# Patient Record
Sex: Female | Born: 1980 | Race: White | Hispanic: Yes | Marital: Single | Smoking: Never smoker
Health system: Southern US, Community
[De-identification: ages and names within clinical notes are randomized; demographics above are authoritative.]

## PROBLEM LIST (undated history)

## (undated) ENCOUNTER — Inpatient Hospital Stay (HOSPITAL_COMMUNITY): Payer: Self-pay

## (undated) DIAGNOSIS — K8020 Calculus of gallbladder without cholecystitis without obstruction: Secondary | ICD-10-CM

## (undated) DIAGNOSIS — K2970 Gastritis, unspecified, without bleeding: Secondary | ICD-10-CM

## (undated) HISTORY — PX: NO PAST SURGERIES: SHX2092

## (undated) SURGERY — LAPAROSCOPIC CHOLECYSTECTOMY
Anesthesia: Choice

---

## 2005-08-14 ENCOUNTER — Ambulatory Visit: Payer: Self-pay | Admitting: Family Medicine

## 2005-08-14 DIAGNOSIS — N940 Mittelschmerz: Secondary | ICD-10-CM

## 2005-08-15 ENCOUNTER — Ambulatory Visit: Payer: Self-pay | Admitting: *Deleted

## 2005-09-17 ENCOUNTER — Encounter (INDEPENDENT_AMBULATORY_CARE_PROVIDER_SITE_OTHER): Payer: Self-pay | Admitting: Family Medicine

## 2005-09-17 ENCOUNTER — Ambulatory Visit: Payer: Self-pay | Admitting: Family Medicine

## 2005-09-17 LAB — CONVERTED CEMR LAB: Pap Smear: NORMAL

## 2006-12-05 ENCOUNTER — Ambulatory Visit: Payer: Self-pay | Admitting: Internal Medicine

## 2006-12-05 ENCOUNTER — Telehealth (INDEPENDENT_AMBULATORY_CARE_PROVIDER_SITE_OTHER): Payer: Self-pay | Admitting: *Deleted

## 2006-12-05 ENCOUNTER — Encounter (INDEPENDENT_AMBULATORY_CARE_PROVIDER_SITE_OTHER): Payer: Self-pay | Admitting: Family Medicine

## 2006-12-05 LAB — CONVERTED CEMR LAB
Bilirubin Urine: NEGATIVE
Ketones, urine, test strip: NEGATIVE
Nitrite: NEGATIVE
Specific Gravity, Urine: <1.005
Urobilinogen, UA: NEGATIVE

## 2006-12-06 ENCOUNTER — Encounter (INDEPENDENT_AMBULATORY_CARE_PROVIDER_SITE_OTHER): Payer: Self-pay | Admitting: Internal Medicine

## 2006-12-09 ENCOUNTER — Telehealth (INDEPENDENT_AMBULATORY_CARE_PROVIDER_SITE_OTHER): Payer: Self-pay | Admitting: *Deleted

## 2006-12-09 ENCOUNTER — Encounter (INDEPENDENT_AMBULATORY_CARE_PROVIDER_SITE_OTHER): Payer: Self-pay | Admitting: Nurse Practitioner

## 2007-07-21 ENCOUNTER — Encounter (INDEPENDENT_AMBULATORY_CARE_PROVIDER_SITE_OTHER): Payer: Self-pay | Admitting: Family Medicine

## 2007-07-21 ENCOUNTER — Ambulatory Visit: Payer: Self-pay | Admitting: Family Medicine

## 2007-07-21 LAB — CONVERTED CEMR LAB
Bilirubin Urine: NEGATIVE
Preg, Serum: NEGATIVE
Protein, U semiquant: NEGATIVE
Specific Gravity, Urine: <1.005
Urobilinogen, UA: NEGATIVE
WBC Urine, dipstick: NEGATIVE
pH: 6

## 2007-07-22 ENCOUNTER — Ambulatory Visit: Payer: Self-pay | Admitting: Family Medicine

## 2007-09-16 ENCOUNTER — Ambulatory Visit: Payer: Self-pay | Admitting: Family Medicine

## 2008-11-18 ENCOUNTER — Emergency Department (HOSPITAL_COMMUNITY): Admission: EM | Admit: 2008-11-18 | Discharge: 2008-11-19 | Payer: Self-pay | Admitting: Emergency Medicine

## 2011-03-27 NOTE — L&D Delivery Note (Addendum)
Delivery Note At 4:02 AM a viable and healthy female was delivered via Vaginal, Spontaneous Delivery (Presentation: Left Occiput Anterior).  APGAR: 8, 9; weight pending .  Infant placed in mother's arms immediately following delivery.  Placenta status: spontaneous, intact.  Cord: 3-vessel with the following complications: None.    Anesthesia: Epidural  Episiotomy: none  Lacerations: none Suture Repair: N/A Est. Blood Loss (mL): 300   Mom to postpartum.  Baby to nursery-stable.  LEFTWICH-KIRBY, LISA 09/22/2011, 4:21 AM   I agree with above note and its content

## 2011-04-04 ENCOUNTER — Other Ambulatory Visit (HOSPITAL_COMMUNITY): Payer: Self-pay | Admitting: Physician Assistant

## 2011-04-04 DIAGNOSIS — R1021 Pelvic and perineal pain right side: Secondary | ICD-10-CM

## 2011-04-04 DIAGNOSIS — R102 Pelvic and perineal pain: Secondary | ICD-10-CM

## 2011-04-04 DIAGNOSIS — Z3689 Encounter for other specified antenatal screening: Secondary | ICD-10-CM

## 2011-04-06 ENCOUNTER — Ambulatory Visit (HOSPITAL_COMMUNITY)
Admission: RE | Admit: 2011-04-06 | Discharge: 2011-04-06 | Disposition: A | Discharge status: Home / Self Care | Payer: Self-pay | Source: Ambulatory Visit | Attending: Physician Assistant | Admitting: Physician Assistant

## 2011-04-06 DIAGNOSIS — R102 Pelvic and perineal pain: Secondary | ICD-10-CM

## 2011-04-06 DIAGNOSIS — Z3689 Encounter for other specified antenatal screening: Secondary | ICD-10-CM | POA: Insufficient documentation

## 2011-04-06 DIAGNOSIS — R1031 Right lower quadrant pain: Secondary | ICD-10-CM | POA: Insufficient documentation

## 2011-04-06 DIAGNOSIS — O99891 Other specified diseases and conditions complicating pregnancy: Secondary | ICD-10-CM | POA: Insufficient documentation

## 2011-04-10 ENCOUNTER — Other Ambulatory Visit (HOSPITAL_COMMUNITY): Payer: Self-pay | Admitting: Physician Assistant

## 2011-04-10 DIAGNOSIS — K8020 Calculus of gallbladder without cholecystitis without obstruction: Secondary | ICD-10-CM

## 2011-04-13 ENCOUNTER — Ambulatory Visit (HOSPITAL_COMMUNITY)
Admission: RE | Admit: 2011-04-13 | Discharge: 2011-04-13 | Disposition: A | Discharge status: Home / Self Care | Payer: Self-pay | Source: Ambulatory Visit | Attending: Physician Assistant | Admitting: Physician Assistant

## 2011-04-13 DIAGNOSIS — O9989 Other specified diseases and conditions complicating pregnancy, childbirth and the puerperium: Secondary | ICD-10-CM | POA: Insufficient documentation

## 2011-04-13 DIAGNOSIS — K8020 Calculus of gallbladder without cholecystitis without obstruction: Secondary | ICD-10-CM | POA: Insufficient documentation

## 2011-04-13 DIAGNOSIS — R1011 Right upper quadrant pain: Secondary | ICD-10-CM | POA: Insufficient documentation

## 2011-04-24 ENCOUNTER — Encounter (INDEPENDENT_AMBULATORY_CARE_PROVIDER_SITE_OTHER): Payer: Self-pay | Admitting: General Surgery

## 2011-04-24 ENCOUNTER — Ambulatory Visit (INDEPENDENT_AMBULATORY_CARE_PROVIDER_SITE_OTHER): Payer: Self-pay | Admitting: General Surgery

## 2011-04-24 DIAGNOSIS — K8020 Calculus of gallbladder without cholecystitis without obstruction: Secondary | ICD-10-CM | POA: Insufficient documentation

## 2011-04-24 NOTE — Progress Notes (Signed)
Subjective:     Patient ID: Katrina Carter, female   DOB: 23-Sep-1980, 31 y.o.   MRN: [id number]  HPI We're asked to see the patient in consultation by Dr. Wyvonnia Dusky to evaluate her for gallstones. The patient is a 31 year old Hispanic female who is about [redacted] weeks pregnant who has been experiencing some right flank pain. The pain has not been associated with nausea and vomiting. She has been able to eat and gain weight. She does also no problems with constipation. This has been going on since she found out she was pregnant.  Review of Systems  Constitutional: Negative.   HENT: Negative.   Eyes: Negative.   Respiratory: Negative.   Cardiovascular: Negative.   Gastrointestinal: Positive for constipation and abdominal distention.  Genitourinary: Negative.   Musculoskeletal: Negative.   Skin: Negative.   Neurological: Negative.   Hematological: Negative.   Psychiatric/Behavioral: Negative.        Objective:   Physical Exam  Constitutional: She is oriented to person, place, and time. She appears well-developed and well-nourished.  HENT:  Head: Normocephalic and atraumatic.  Eyes: Conjunctivae and EOM are normal. Pupils are equal, round, and reactive to light.  Neck: Normal range of motion. Neck supple.  Cardiovascular: Normal rate, regular rhythm and normal heart sounds.   Pulmonary/Chest: Effort normal and breath sounds normal.  Abdominal: Soft. Bowel sounds are normal.       Mild tenderness in the right upper quadrant. No guarding or peritonitis. No palpable upper abdominal mass.  Musculoskeletal: Normal range of motion.  Neurological: She is alert and oriented to person, place, and time.  Skin: Skin is warm and dry.  Psychiatric: She has a normal mood and affect. Her behavior is normal.       Assessment:     The patient appears to have gallstones on a recent abdominal ultrasound. The ultrasound also suggested that she may have common duct stones. Fortunately her liver  functions are normal and she seems to be tolerating this well. She has some mild right upper quadrant pain but she is able to eat and gain weight appropriately.    Plan:     At this point since she can eat and gain weight I think we should treat her pain symptomatically and try to get her through the pregnancy. After she delivers then we can plan for laparoscopic cholecystectomy. This will minimize any risks to the baby. She is in favor of this plan. We will plan to see her back after she delivers unless things change in the meantime.

## 2011-04-26 ENCOUNTER — Encounter (INDEPENDENT_AMBULATORY_CARE_PROVIDER_SITE_OTHER): Payer: Self-pay

## 2011-06-29 ENCOUNTER — Encounter (HOSPITAL_COMMUNITY): Payer: Self-pay

## 2011-06-29 ENCOUNTER — Inpatient Hospital Stay (HOSPITAL_COMMUNITY)
Admission: AD | Admit: 2011-06-29 | Discharge: 2011-06-30 | Disposition: A | Discharge status: Home / Self Care | Payer: Self-pay | Source: Ambulatory Visit | Attending: Obstetrics and Gynecology | Admitting: Obstetrics and Gynecology

## 2011-06-29 ENCOUNTER — Inpatient Hospital Stay (HOSPITAL_COMMUNITY): Payer: Self-pay

## 2011-06-29 DIAGNOSIS — O99891 Other specified diseases and conditions complicating pregnancy: Secondary | ICD-10-CM | POA: Insufficient documentation

## 2011-06-29 DIAGNOSIS — F329 Major depressive disorder, single episode, unspecified: Secondary | ICD-10-CM | POA: Clinically undetermined

## 2011-06-29 DIAGNOSIS — O479 False labor, unspecified: Secondary | ICD-10-CM

## 2011-06-29 DIAGNOSIS — F32A Depression, unspecified: Secondary | ICD-10-CM | POA: Clinically undetermined

## 2011-06-29 DIAGNOSIS — R1031 Right lower quadrant pain: Secondary | ICD-10-CM | POA: Insufficient documentation

## 2011-06-29 LAB — URINALYSIS, ROUTINE W REFLEX MICROSCOPIC
Bilirubin Urine: NEGATIVE
Glucose, UA: NEGATIVE mg/dL
Hgb urine dipstick: NEGATIVE
Specific Gravity, Urine: <1.005 — ABNORMAL LOW (ref 1.005–1.030)
pH: 6.5 (ref 5.0–8.0)

## 2011-06-29 LAB — CBC
HCT: 36.8 % (ref 36.0–46.0)
Hemoglobin: 12.4 g/dL (ref 12.0–15.0)
MCH: 33.4 pg (ref 26.0–34.0)
MCHC: 33.7 g/dL (ref 30.0–36.0)
MCV: 99.2 fL (ref 78.0–100.0)
RBC: 3.71 MIL/uL — ABNORMAL LOW (ref 3.87–5.11)

## 2011-06-29 LAB — WET PREP, GENITAL
Clue Cells Wet Prep HPF POC: NONE SEEN
Trich, Wet Prep: NONE SEEN
Yeast Wet Prep HPF POC: NONE SEEN

## 2011-06-29 LAB — URINE MICROSCOPIC-ADD ON

## 2011-06-29 MED ORDER — TERBUTALINE SULFATE 1 MG/ML IJ SOLN
INTRAMUSCULAR | Status: AC
  Filled 2011-06-29: qty 1

## 2011-06-29 MED ORDER — NIFEDIPINE 10 MG PO CAPS
10.0000 mg | ORAL_CAPSULE | Freq: Three times a day (TID) | ORAL | Status: DC
  Administered 2011-06-29: 10 mg via ORAL
  Filled 2011-06-29 (×2): qty 1

## 2011-06-29 MED ORDER — NIFEDIPINE 10 MG PO CAPS
10.0000 mg | ORAL_CAPSULE | Freq: Three times a day (TID) | ORAL | Status: DC
  Administered 2011-06-29: 10 mg via ORAL

## 2011-06-29 MED ORDER — TERBUTALINE SULFATE 1 MG/ML IJ SOLN
0.2500 mg | Freq: Once | INTRAMUSCULAR | Status: AC
  Administered 2011-06-29: 0.25 mg via SUBCUTANEOUS

## 2011-06-29 NOTE — MAU Note (Signed)
[employer] translator [id number]. Patient states she was seen in the office today and was having contractions and was sent to MAU. Patient has a placenta previa. Reports no bleeding but does have some contractions.

## 2011-06-29 NOTE — MAU Provider Note (Signed)
Chief Complaint:  Contractions   First Provider Initiated Contact with Patient 06/29/11 1901      HPI  Adline Kirshenbaum is  31 y.o. G2P1001 at [redacted]w[redacted]d presents with suprapubic pain and RLQ pain x2 days worsening today.  Denies contractions, leakage of fluid or vaginal bleeding. Good fetal movement.  She is seeing [facility] for prenatal care but had a recent U/S at Dr Elsie Stain office for reduced costs.  Her U/S at Dr Elsie Stain office indicated a placenta previa and she was given precautions and told another U/S would be performed later in pregnancy to see if previa resolved.   Pregnancy Course: uncomplicated  Past Medical History: Past Medical History  Diagnosis Date  . Abdominal pain   . Placenta previa antepartum     Past Surgical History: Past Surgical History  Procedure Date  . No past surgeries     Family History: Family History  Problem Relation Age of Onset  . Anesthesia problems Neg Hx   . Hypotension Neg Hx   . Malignant hyperthermia Neg Hx   . Pseudochol deficiency Neg Hx     Social History: History  Substance Use Topics  . Smoking status: Never Smoker   . Smokeless tobacco: Never Used  . Alcohol Use: No    Allergies: No Known Allergies  Meds:  Prescriptions prior to admission  Medication Sig Dispense Refill  . Prenatal Vit-Fe Fumarate-FA (PRENATAL MULTIVITAMIN) TABS Take 1 tablet by mouth daily.          Physical Exam  Blood pressure 114/70, pulse 87, temperature 98.7 F (37.1 C), temperature source Oral, resp. rate 16, height 4' 10.5" (1.486 m), weight 72.303 kg (159 lb 6.4 oz), SpO2 100.00%. GENERAL: Well-developed, well-nourished female in no acute distress.  ABDOMEN: Soft, nontender, nondistended, gravid.  EXTREMITIES: Nontender, no edema, 2+ distal pulses. Pelvic exam deferred until U/S confirmation of previa  FHT:  Baseline 135 , moderate variability, accelerations present, no decelerations Contractions: uterine irritability   Labs: CBC and  ABO/Rh ordered Results for orders placed during the hospital encounter of 06/29/11 (from the past 24 hour(s))  URINALYSIS, ROUTINE W REFLEX MICROSCOPIC     Status: Abnormal   Collection Time   06/29/11  6:00 PM      Component Value Range   Color, Urine STRAW (*) YELLOW    APPearance HAZY (*) CLEAR    Specific Gravity, Urine <1.005 (*) 1.005 - 1.030    pH 6.5  5.0 - 8.0    Glucose, UA NEGATIVE  NEGATIVE (mg/dL)   Hgb urine dipstick NEGATIVE  NEGATIVE    Bilirubin Urine NEGATIVE  NEGATIVE    Ketones, ur NEGATIVE  NEGATIVE (mg/dL)   Protein, ur NEGATIVE  NEGATIVE (mg/dL)   Urobilinogen, UA 0.2  0.0 - 1.0 (mg/dL)   Nitrite NEGATIVE  NEGATIVE    Leukocytes, UA SMALL (*) NEGATIVE   URINE MICROSCOPIC-ADD ON     Status: Abnormal   Collection Time   06/29/11  6:00 PM      Component Value Range   Squamous Epithelial / LPF MANY (*) RARE    WBC, UA 3-6  <3 (WBC/hpf)   Bacteria, UA RARE  RARE    Imaging:  Limited U/S to evaluate placenta ordered  Assessment/Plan: Pt needs U/S to evaluate previa per Dr Marice Potter prior to pt arrival Pt history and symptoms discussed with Dr Emelda Fear.  Plan to send pt for limited U/S to evaluate previa prior to pelvic exam.  Care assumed by Wynelle Bourgeois, CNM at  2020 LEFTWICH-KIRBY, LISA 4/5/20137:54 PM  Speculum exam done Cervix is long and closed SP area is tender to touch, but sonographer did not note tenderness with that exam GC/Chlam and wet prep collected  Will give one dose of Procardia. >> UCs continued, gave second dose, they continued. >> gave one  Dose of Terb with resolution of UCs. Will d/c home with F/U in HD clinic  Discussed with Dr Emelda Fear who recommends d/c home.

## 2011-06-29 NOTE — MAU Note (Signed)
Per Maday, Spanish interpreter, pt states suprapubic pain since Wednesday, was seen at Dr. Elsie Stain office for routine appt. Noted pt having ctx's. Was sent to MAU for further eval. Pt notes burning with voiding. Denies bleeding today, noted yellow thick non odorous vaginal d/c

## 2011-06-30 LAB — GC/CHLAMYDIA PROBE AMP, GENITAL
Chlamydia, DNA Probe: NEGATIVE
GC Probe Amp, Genital: NEGATIVE

## 2011-06-30 NOTE — Discharge Instructions (Signed)
Prevencin de Sport and exercise psychologist (Preventing Preterm Labor) Un parto prematuro ocurre cuando la mujer embarazada tiene contracciones uterinas que causan la apertura, el acortamiento y el afinamiento del cuello del tero, antes de las 37 semanas de [place]. Tendr contracciones regulares cada 2 a 3 minutos. Esto generalmente causa molestias o dolor. CUIDADOS EN EL HOGAR  Consuma una dieta saludable.   [employer] las vitaminas segn le haya indicado el mdico.   Beba una cantidad de lquido suficiente como para Pharmacologist la orina de tono claro o color amarillo plido todos [place].   Descanse y duerma.   No tenga relaciones sexuales si tiene un parto prematuro o alto riesgo de tenerlo.   Siga las instrucciones del mdico acerca de su [place], los medicamentos y los exmenes.   Evite el estrs.   Evite los esfuerzos extenuantes o la actividad fsica extensa.   No fume.  SOLICITE AYUDA DE INMEDIATO SI:   Tiene contracciones.   Siente dolor abdominal.   Tiene sangrado que proviene de la vagina.   Siente dolor al [employer].   Observa una secrecin anormal que proviene de la vagina.   Tiene una temperatura oral de ms de 102 F (38.9 C).  ASEGRESE DE QUE:  Comprende estas instrucciones.   Controlar su enfermedad.   Solicitar ayuda si no mejora o si empeora.  Document Released: 04/14/2010 Document Revised: 03/01/2011 [facility] Patient Information 2012 [place], [place].

## 2011-07-01 NOTE — MAU Provider Note (Signed)
Attestation of Attending Supervision of Advanced Practitioner: Evaluation and management procedures were performed by the PA/NP/CNM/OB Fellow under my supervision/collaboration. Chart reviewed and agree with management and plan. I personally observed the ultrasound performance, performed abdominal exam, and the cervix was nontender during ultrasound assessment, and the placenta showed no suggestion of abruption or bleeding. Will follow up with primary care site. Nikolina Simerson V 07/01/2011 12:02 PM

## 2011-07-31 ENCOUNTER — Encounter (INDEPENDENT_AMBULATORY_CARE_PROVIDER_SITE_OTHER): Payer: Self-pay | Admitting: General Surgery

## 2011-08-31 LAB — OB RESULTS CONSOLE GBS: GBS: NEGATIVE

## 2011-09-21 ENCOUNTER — Inpatient Hospital Stay (HOSPITAL_COMMUNITY): Payer: Medicaid Other | Admitting: Anesthesiology

## 2011-09-21 ENCOUNTER — Encounter (HOSPITAL_COMMUNITY): Payer: Self-pay | Admitting: *Deleted

## 2011-09-21 ENCOUNTER — Encounter (HOSPITAL_COMMUNITY): Payer: Self-pay | Admitting: Anesthesiology

## 2011-09-21 ENCOUNTER — Inpatient Hospital Stay (HOSPITAL_COMMUNITY): Payer: Medicaid Other

## 2011-09-21 ENCOUNTER — Inpatient Hospital Stay (HOSPITAL_COMMUNITY)
Admission: AD | Admit: 2011-09-21 | Discharge: 2011-09-23 | DRG: 774 | Disposition: A | Discharge status: Home / Self Care | Payer: Medicaid Other | Source: Ambulatory Visit | Attending: Obstetrics & Gynecology | Admitting: Obstetrics & Gynecology

## 2011-09-21 DIAGNOSIS — O4690 Antepartum hemorrhage, unspecified, unspecified trimester: Secondary | ICD-10-CM | POA: Diagnosis present

## 2011-09-21 DIAGNOSIS — O4100X0 Oligohydramnios, unspecified trimester, not applicable or unspecified: Principal | ICD-10-CM | POA: Diagnosis present

## 2011-09-21 HISTORY — DX: Calculus of gallbladder without cholecystitis without obstruction: K80.20

## 2011-09-21 LAB — CBC
HCT: 37.4 % (ref 36.0–46.0)
Hemoglobin: 12.8 g/dL (ref 12.0–15.0)
MCH: 33.5 pg (ref 26.0–34.0)
MCHC: 34.2 g/dL (ref 30.0–36.0)
MCV: 97.9 fL (ref 78.0–100.0)
Platelets: 225 K/uL (ref 150–400)
RBC: 3.82 MIL/uL — ABNORMAL LOW (ref 3.87–5.11)
RDW: 13.3 % (ref 11.5–15.5)
WBC: 10.2 K/uL (ref 4.0–10.5)

## 2011-09-21 MED ORDER — EPHEDRINE 5 MG/ML INJ
10.0000 mg | INTRAVENOUS | Status: DC | PRN
  Administered 2011-09-21: 10 mg via INTRAVENOUS
  Filled 2011-09-21: qty 4

## 2011-09-21 MED ORDER — FENTANYL 2.5 MCG/ML BUPIVACAINE 1/10 % EPIDURAL INFUSION (WH - ANES)
14.0000 mL/h | INTRAMUSCULAR | Status: DC
  Administered 2011-09-22: 14 mL/h via EPIDURAL
  Filled 2011-09-21 (×2): qty 60

## 2011-09-21 MED ORDER — IBUPROFEN 600 MG PO TABS
600.0000 mg | ORAL_TABLET | Freq: Four times a day (QID) | ORAL | Status: DC | PRN
  Administered 2011-09-22: 600 mg via ORAL
  Filled 2011-09-21: qty 1

## 2011-09-21 MED ORDER — ONDANSETRON HCL 4 MG/2ML IJ SOLN: 4.0000 mg | Freq: Four times a day (QID) | INTRAMUSCULAR | Status: DC | PRN

## 2011-09-21 MED ORDER — SODIUM BICARBONATE 8.4 % IV SOLN
INTRAVENOUS | Status: DC | PRN
  Administered 2011-09-21: 4 mL via EPIDURAL

## 2011-09-21 MED ORDER — LIDOCAINE HCL (PF) 1 % IJ SOLN
30.0000 mL | INTRAMUSCULAR | Status: DC | PRN
  Filled 2011-09-21: qty 30

## 2011-09-21 MED ORDER — PHENYLEPHRINE 40 MCG/ML (10ML) SYRINGE FOR IV PUSH (FOR BLOOD PRESSURE SUPPORT): 80.0000 ug | PREFILLED_SYRINGE | INTRAVENOUS | Status: DC | PRN

## 2011-09-21 MED ORDER — FLEET ENEMA 7-19 GM/118ML RE ENEM: 1.0000 | ENEMA | RECTAL | Status: DC | PRN

## 2011-09-21 MED ORDER — PHENYLEPHRINE 40 MCG/ML (10ML) SYRINGE FOR IV PUSH (FOR BLOOD PRESSURE SUPPORT)
80.0000 ug | PREFILLED_SYRINGE | INTRAVENOUS | Status: DC | PRN
  Administered 2011-09-21: 80 ug via INTRAVENOUS
  Filled 2011-09-21: qty 5

## 2011-09-21 MED ORDER — EPHEDRINE 5 MG/ML INJ: 10.0000 mg | INTRAVENOUS | Status: DC | PRN

## 2011-09-21 MED ORDER — DIPHENHYDRAMINE HCL 50 MG/ML IJ SOLN: 12.5000 mg | INTRAMUSCULAR | Status: DC | PRN

## 2011-09-21 MED ORDER — LACTATED RINGERS IV SOLN: 500.0000 mL | INTRAVENOUS | Status: DC | PRN

## 2011-09-21 MED ORDER — NALBUPHINE SYRINGE 5 MG/0.5 ML
5.0000 mg | INJECTION | INTRAMUSCULAR | Status: DC | PRN
  Administered 2011-09-21 (×2): 10 mg via INTRAVENOUS
  Filled 2011-09-21 (×3): qty 1

## 2011-09-21 MED ORDER — ACETAMINOPHEN 325 MG PO TABS: 650.0000 mg | ORAL_TABLET | ORAL | Status: DC | PRN

## 2011-09-21 MED ORDER — OXYCODONE-ACETAMINOPHEN 5-325 MG PO TABS: 1.0000 | ORAL_TABLET | ORAL | Status: DC | PRN

## 2011-09-21 MED ORDER — OXYTOCIN 40 UNITS IN LACTATED RINGERS INFUSION - SIMPLE MED
62.5000 mL/h | Freq: Once | INTRAVENOUS | Status: AC
  Administered 2011-09-22: 62.5 mL/h via INTRAVENOUS
  Filled 2011-09-21: qty 1000

## 2011-09-21 MED ORDER — CITRIC ACID-SODIUM CITRATE 334-500 MG/5ML PO SOLN: 30.0000 mL | ORAL | Status: DC | PRN

## 2011-09-21 MED ORDER — FENTANYL 2.5 MCG/ML BUPIVACAINE 1/10 % EPIDURAL INFUSION (WH - ANES)
INTRAMUSCULAR | Status: DC | PRN
  Administered 2011-09-21: 13 mL/h via EPIDURAL

## 2011-09-21 MED ORDER — OXYTOCIN BOLUS FROM INFUSION
250.0000 mL | Freq: Once | INTRAVENOUS | Status: DC
  Filled 2011-09-21: qty 500

## 2011-09-21 MED ORDER — LACTATED RINGERS IV SOLN
INTRAVENOUS | Status: DC
  Administered 2011-09-21: 17:00:00 via INTRAVENOUS

## 2011-09-21 MED ORDER — LACTATED RINGERS IV SOLN
500.0000 mL | Freq: Once | INTRAVENOUS | Status: AC
  Administered 2011-09-21: 500 mL via INTRAVENOUS

## 2011-09-21 NOTE — Anesthesia Procedure Notes (Signed)
Epidural Patient location during procedure: OB  Preanesthetic Checklist Completed: patient identified, site marked, surgical consent, pre-op evaluation, timeout performed, IV checked, risks and benefits discussed and monitors and equipment checked  Epidural Patient position: sitting Prep: site prepped and draped and DuraPrep Patient monitoring: continuous pulse ox and blood pressure Approach: midline Injection technique: LOR air  Needle:  Needle type: Tuohy  Needle gauge: 17 G Needle length: 9 cm Needle insertion depth: 5 cm Catheter type: closed end flexible Catheter size: 19 Gauge Catheter at skin depth: 12 cm Test dose: negative  Assessment Events: blood not aspirated, injection not painful, no injection resistance, negative IV test and no paresthesia  Additional Notes Dosing of Epidural:  1st dose, through needle ............................................Marland Kitchen epi 1:200K + Xylocaine 40 mg  2nd dose, through catheter, after waiting 3 minutes...Marland KitchenMarland Kitchenepi 1:200K + Xylocaine 40 mg  3rd dose, through catheter after waiting 3 minutes .............................Marcaine   4mg    ( mg Marcaine are expressed as equivilent  cc's medication removed from the 0.1%Bupiv / fentanyl syringe from L&D pump)  ( 2% Xylo charted as a single dose in Epic Meds for ease of charting; actual dosing was fractionated as above, for saftey's sake)  As each dose occurred, patient was free of IV sx; and patient exhibited no evidence of SA injection.  Patient is more comfortable after epidural dosed. Please see RN's note for documentation of vital signs,and FHR which are stable.  Patient reminded not to try to ambulate with numb legs, and that an RN must be present the 1st time she attempts to get up.

## 2011-09-21 NOTE — Anesthesia Preprocedure Evaluation (Addendum)
Anesthesia Evaluation  Patient identified by MRN, date of birth, ID band Patient awake    Reviewed: Allergy & Precautions, H&P , Patient's Chart, lab work & pertinent test results  Airway Mallampati: II  TM Distance: >3 FB Neck ROM: full    Dental  (+) Teeth Intact   Pulmonary    breath sounds clear to auscultation       Cardiovascular  Rhythm:regular Rate:Normal     Neuro/Psych    GI/Hepatic   Endo/Other  Morbid obesity  Renal/GU      Musculoskeletal   Abdominal   Peds  Hematology   Anesthesia Other Findings       Reproductive/Obstetrics (+) Pregnancy                            Anesthesia Physical Anesthesia Plan  ASA: II  Anesthesia Plan: Epidural   Post-op Pain Management:    Induction:   Airway Management Planned:   Additional Equipment:   Intra-op Plan:   Post-operative Plan:   Informed Consent: I have reviewed the patients History and Physical, chart, labs and discussed the procedure including the risks, benefits and alternatives for the proposed anesthesia with the patient or authorized representative who has indicated his/her understanding and acceptance.   Dental Advisory Given  Plan Discussed with:   Anesthesia Plan Comments: (Labs checked- platelets confirmed with RN in room. Fetal heart tracing, per RN, reported to be stable enough for sitting procedure. Discussed epidural, and patient consents to the procedure:  included risk of possible headache,backache, failed block, allergic reaction, and nerve injury. This patient was asked if she had any questions or concerns before the procedure started.)        Anesthesia Quick Evaluation  

## 2011-09-21 NOTE — Progress Notes (Signed)
Report given to Urban Gibson by Ozella Almond RN

## 2011-09-21 NOTE — MAU Provider Note (Signed)
History    CSN: [id number] Arrival date and time: 09/21/11 1112  None   Chief Complaint  Patient presents with  . Labor Eval   HPI Comments: Pt presented for evaluation following onset of bright red bleed this AM.  She reports some pain associated with bleeding.  Has changed 3 pads in 2 hours that were saturated.  No loss of fluids.  Pain and bleeding have resolved at this time but now having intermittent contractions.  Feeling baby move normally.   OB History    Grav Para Term Preterm Abortions TAB SAB Ect Mult Living   2 1 1  0 0 0 0 0 0 1      Past Medical History  Diagnosis Date  . Abdominal pain     Past Surgical History  Procedure Date  . No past surgeries     Family History  Problem Relation Age of Onset  . Anesthesia problems Neg Hx   . Hypotension Neg Hx   . Malignant hyperthermia Neg Hx   . Pseudochol deficiency Neg Hx     History  Substance Use Topics  . Smoking status: Never Smoker   . Smokeless tobacco: Never Used  . Alcohol Use: No    Allergies: No Known Allergies  Prescriptions prior to admission  Medication Sig Dispense Refill  . Prenatal Vit-Fe Fumarate-FA (PRENATAL MULTIVITAMIN) TABS Take 1 tablet by mouth daily.        Review of Systems  Constitutional: Negative for fever and chills.  HENT: Negative for congestion.   Eyes: Negative for blurred vision and double vision.  Respiratory: Negative for cough, shortness of breath and wheezing.   Cardiovascular: Positive for leg swelling (mild throughout pregnancy; unchanged).  Gastrointestinal: Positive for abdominal pain. Negative for nausea and vomiting.  Genitourinary: Negative for dysuria, urgency and frequency.  Musculoskeletal: Negative for myalgias and joint pain.  Neurological: Negative.  Negative for headaches.  Endo/Heme/Allergies: Negative.   Psychiatric/Behavioral: Negative.    Physical Exam   Blood pressure 113/73, pulse 93, temperature 98.4 F (36.9 C), temperature source  Oral, resp. rate 16, height 4' 10.5" (1.486 m), weight 79.379 kg (175 lb), SpO2 100.00%.  Physical Exam  Nursing note and vitals reviewed. Constitutional: She appears well-developed and well-nourished. No distress.  HENT:  Head: Normocephalic and atraumatic.  Cardiovascular: Normal rate, regular rhythm, normal heart sounds and intact distal pulses.  Exam reveals no gallop and no friction rub.   No murmur heard. Respiratory: Effort normal and breath sounds normal. No respiratory distress. She has no wheezes. She has no rales.  GI: Soft. She exhibits distension and mass. There is no tenderness. There is no rebound and no guarding.  Genitourinary: Vagina normal and uterus normal.       Dilation: Closed Effacement (%): Thick Cervical Position: Posterior Station: -3 Presentation: Vertex Exam by:: Dr Berline Chough   Musculoskeletal: She exhibits edema (2+/4). She exhibits no tenderness.  Neurological: She exhibits normal muscle tone.  Skin: Skin is warm and dry. No rash noted. She is not diaphoretic. No erythema. No pallor.  Psychiatric: She has a normal mood and affect. Her behavior is normal. Judgment and thought content normal.   Fetal Wellbeing - Baseline Rate 130: Moderate Variability, Accelerations present, No decelerations  MAU Course  Procedures   MDM Pt presents with story concerning for placental abruption.  Limited Ultrasound obtained.  Assessment and Plan  Admit to L&D for induction of labor 2o/2 Oligohydramnios See H&P for further details   Andrena Mews, DO  [facility] Resident - PGY-1 09/21/2011 3:39 PM

## 2011-09-21 NOTE — H&P (Signed)
Katrina Carter is a 31 y.o. female presenting for evaluation of bleeding Maternal Medical History:  Reason for admission: Reason for admission: vaginal bleeding.  Pt presented for evaluation following onset of bright red bleed this AM.  She reports some pain associated with bleeding.  Has changed 3 pads in 2 hours that were saturated.  No loss of fluids.  Pain and bleeding have resolved at this time but now having intermittent contractions.  Feeling baby move normally.  Found to have Oligohydramnios on [place] with AFI of 3.36   Fetal activity: Perceived fetal activity is normal.   Last perceived fetal movement was within the past hour.    Prenatal complications: No bleeding, cholelithiasis, HIV, hypertension, infection, IUGR, nephrolithiasis, oligohydramnios, placental abnormality, polyhydramnios, pre-eclampsia, preterm labor, substance abuse, thrombocytopenia or thrombophilia.     OB History    Grav Para Term Preterm Abortions TAB SAB Ect Mult Living   2 1 1  0 0 0 0 0 0 1     Past Medical History  Diagnosis Date  . Abdominal pain    Past Surgical History  Procedure Date  . No past surgeries    Family History: family history is negative for Anesthesia problems, and Hypotension, and Malignant hyperthermia, and Pseudochol deficiency, . Social History:  reports that she has never smoked. She has never used smokeless tobacco. She reports that she does not drink alcohol or use illicit drugs.  Health Dept, Has gallstones (surg postpartum), Questionable history of chronic Hypertension  Genetic Screen  Quad - negative;   Anatomic [place]  20wk Placenta Previa - Resolved on f/u ultrasound  Glucose Screen  1hr: 04/03/11 - 64; 07/05/11 - 122  GC / Chlamydia   Negative  GBS   Negative  Feeding Preference    Contraception    Circumcision       Prenatal Transfer Tool  Maternal Diabetes: No Genetic Screening: Normal Maternal Ultrasounds/Referrals: Normal (resolved placenta previa) Fetal  Ultrasounds or other Referrals:  None Maternal Substance Abuse:  No Significant Maternal Medications:  None Significant Maternal Lab Results:  None Other Comments:  None  ROS Review of Systems  Constitutional: Negative for fever and chills.  HENT: Negative for congestion.  Eyes: Negative for blurred vision and double vision.  Respiratory: Negative for cough, shortness of breath and wheezing.  Cardiovascular: Positive for leg swelling (mild throughout pregnancy; unchanged).  Gastrointestinal: Positive for abdominal pain. Negative for nausea and vomiting.  Genitourinary: Negative for dysuria, urgency and frequency.  Musculoskeletal: Negative for myalgias and joint pain.  Neurological: Negative. Negative for headaches.  Endo/Heme/Allergies: Negative.  Psychiatric/Behavioral: Negative   Dilation: Closed Effacement (%): Thick Station: -3 Exam by:: Dr Berline Chough Blood pressure 113/73, pulse 93, temperature 98.4 F (36.9 C), temperature source Oral, resp. rate 16, height 4' 10.5" (1.486 m), weight 79.379 kg (175 lb), SpO2 100.00%. Maternal Exam:  Uterine Assessment: Contraction frequency is irregular.   Abdomen: Fundal height is appropriate for age.   Fetal presentation: vertex  Introitus: Normal vulva. Normal vagina.  Ferning test: negative.   Pelvis: adequate for delivery.   Cervix: Cervix evaluated by digital exam.     Fetal Exam Fetal Monitor Review: Mode: ultrasound.   Baseline rate: 135.  Variability: moderate (6-25 bpm).   Pattern: accelerations present and no decelerations.    Fetal State Assessment: Category I - tracings are normal.     Physical Exam  Nursing note and vitals reviewed.  Constitutional: She appears well-developed and well-nourished. No distress.  HENT:  Head: Normocephalic  and atraumatic.  Cardiovascular: Normal rate, regular rhythm, normal heart sounds and intact distal pulses. Exam reveals no gallop and no friction rub.  No murmur heard.    Respiratory: Effort normal and breath sounds normal. No respiratory distress. She has no wheezes. She has no rales.  GI: Soft. She exhibits distension and mass; There is tenderness diffusely. There is no rebound and no guarding.  Genitourinary: Vagina normal and uterus normal Dilation: 1.5 Effacement (%): 50 Cervical Position: Posterior Station: -3 Presentation: Vertex Exam by:: [email or web address]   Prenatal labs: ABO, Rh: --/--/O POS (04/05 2005) Antibody:  negative Rubella:  immune RPR:   non-reactive HBsAg:   negative HIV:   non-reactive GBS:   negative  Assessment/Plan: To L&D for IOL for oligohydramnios with Foley Bulb Continued bleeding.  Will be high risk for PPH.  Type and Cross. Continuous monitoring.    Case discussed with Sid Falcon CNM  Andrena Mews, DO [facility] Resident - PGY-1 09/21/2011 4:26 PM

## 2011-09-21 NOTE — MAU Note (Signed)
Patient states she was seen at [facility] yesterday and was closed. Started bleeding like a period this am. Had contractions all night, has had 2 since arriving. Reports good fetal movement.

## 2011-09-21 NOTE — MAU Note (Signed)
States pressure when she urinates but denies any pain.

## 2011-09-21 NOTE — Progress Notes (Signed)
   Subjective: Pt reports increase in contractions and pain;  Received one dose of pain meds.  Objective: BP 103/56  Pulse 76  Temp 97.6 F (36.4 C) (Oral)  Resp 16  Ht 4' 10.5" (1.486 m)  Wt 79.379 kg (175 lb)  BMI 35.95 kg/m2  SpO2 100%      FHT:  FHR: 130's bpm, variability: moderate,  accelerations:  Present,  decelerations:  Absent UC:   regular, every 2-3 minutes SVE:   Dilation: 2.5 Effacement (%): 50 Station: -3 Exam by:: Currie Paris ,RN  Labs: Lab Results  Component Value Date   WBC 10.2 09/21/2011   HGB 12.8 09/21/2011   HCT 37.4 09/21/2011   MCV 97.9 09/21/2011   PLT 225 09/21/2011    Assessment / Plan: Spontaneous labor, progressing normally  Labor: Progressing normally Preeclampsia:  n/a Fetal Wellbeing:  Category I Pain Control:  IV pain meds I/D:  n/a Anticipated MOD:  NSVD  [facility] 09/21/2011, 7:34 PM

## 2011-09-21 NOTE — Progress Notes (Signed)
MEGHANA TULLO is a 31 y.o. G2P1001 at [redacted]w[redacted]d admitted due to oligohydramnios.  No induction method started r/t pt frequent contractions.  Currently expectant management for active labor.    Subjective: Pt comfortable with epidural.  Family member at bedside for support.  Objective: BP 85/46  Pulse 66  Temp 97.7 F (36.5 C) (Oral)  Resp 20  Ht 4' 10.5" (1.486 m)  Wt 79.379 kg (175 lb)  BMI 35.95 kg/m2  SpO2 99%      FHT:  FHR: 145 bpm, variability: moderate,  accelerations:  Present,  decelerations:  Present lates following epidural r/t hypotension.  Treated with ephedrine per anesthesia. UC:   regular, every 2-3 minutes SVE:   4/70/-3, vertex, BBOW by Sharen Counter, CNM  Labs: Lab Results  Component Value Date   WBC 10.2 09/21/2011   HGB 12.8 09/21/2011   HCT 37.4 09/21/2011   MCV 97.9 09/21/2011   PLT 225 09/21/2011    Assessment / Plan: Spontaneous labor, progressing normally Discussed AROM with pt, pt wants to sleep with epidural at this time so will reevaluate in 2-3 hours  Labor: Progressing normally Preeclampsia:  N/A Fetal Wellbeing:  Category II Pain Control:  Epidural I/D:  n/a Anticipated MOD:  NSVD  LEFTWICH-KIRBY, Abbigael Detlefsen 09/21/2011, 11:07 PM

## 2011-09-21 NOTE — MAU Note (Signed)
Dr Berline Chough in room with pt discussing plan of care with interpreter.

## 2011-09-21 NOTE — MAU Note (Signed)
Dr Berline Chough in with pt and interpreter discussing plan of care.

## 2011-09-22 ENCOUNTER — Encounter (HOSPITAL_COMMUNITY): Payer: Self-pay | Admitting: *Deleted

## 2011-09-22 DIAGNOSIS — O4100X0 Oligohydramnios, unspecified trimester, not applicable or unspecified: Secondary | ICD-10-CM

## 2011-09-22 DIAGNOSIS — O4690 Antepartum hemorrhage, unspecified, unspecified trimester: Secondary | ICD-10-CM

## 2011-09-22 MED ORDER — TETANUS-DIPHTH-ACELL PERTUSSIS 5-2.5-18.5 LF-MCG/0.5 IM SUSP
0.5000 mL | Freq: Once | INTRAMUSCULAR | Status: AC
  Administered 2011-09-23: 0.5 mL via INTRAMUSCULAR
  Filled 2011-09-22: qty 0.5

## 2011-09-22 MED ORDER — SIMETHICONE 80 MG PO CHEW: 80.0000 mg | CHEWABLE_TABLET | ORAL | Status: DC | PRN

## 2011-09-22 MED ORDER — ONDANSETRON HCL 4 MG PO TABS
4.0000 mg | ORAL_TABLET | ORAL | Status: DC | PRN
Start: 2011-09-22 — End: 2011-09-23

## 2011-09-22 MED ORDER — LANOLIN HYDROUS EX OINT: TOPICAL_OINTMENT | CUTANEOUS | Status: DC | PRN

## 2011-09-22 MED ORDER — IBUPROFEN 600 MG PO TABS
600.0000 mg | ORAL_TABLET | Freq: Four times a day (QID) | ORAL | Status: DC
  Administered 2011-09-22 – 2011-09-23 (×5): 600 mg via ORAL
  Filled 2011-09-22 (×5): qty 1

## 2011-09-22 MED ORDER — BENZOCAINE-MENTHOL 20-0.5 % EX AERO
1.0000 "application " | INHALATION_SPRAY | CUTANEOUS | Status: DC | PRN
  Administered 2011-09-22: 1 via TOPICAL
  Filled 2011-09-22: qty 56

## 2011-09-22 MED ORDER — DIBUCAINE 1 % RE OINT: 1.0000 "application " | TOPICAL_OINTMENT | RECTAL | Status: DC | PRN

## 2011-09-22 MED ORDER — PRENATAL MULTIVITAMIN CH
1.0000 | ORAL_TABLET | Freq: Every day | ORAL | Status: DC
  Administered 2011-09-22 – 2011-09-23 (×2): 1 via ORAL
  Filled 2011-09-22 (×2): qty 1

## 2011-09-22 MED ORDER — ZOLPIDEM TARTRATE 5 MG PO TABS
5.0000 mg | ORAL_TABLET | Freq: Every evening | ORAL | Status: DC | PRN
Start: 2011-09-22 — End: 2011-09-23

## 2011-09-22 MED ORDER — OXYCODONE-ACETAMINOPHEN 5-325 MG PO TABS
1.0000 | ORAL_TABLET | ORAL | Status: DC | PRN
  Administered 2011-09-22: 1 via ORAL
  Filled 2011-09-22: qty 1

## 2011-09-22 MED ORDER — ONDANSETRON HCL 4 MG/2ML IJ SOLN: 4.0000 mg | INTRAMUSCULAR | Status: DC | PRN

## 2011-09-22 MED ORDER — SENNOSIDES-DOCUSATE SODIUM 8.6-50 MG PO TABS
2.0000 | ORAL_TABLET | Freq: Every day | ORAL | Status: DC
  Administered 2011-09-22: 2 via ORAL

## 2011-09-22 MED ORDER — DIPHENHYDRAMINE HCL 25 MG PO CAPS: 25.0000 mg | ORAL_CAPSULE | Freq: Four times a day (QID) | ORAL | Status: DC | PRN

## 2011-09-22 MED ORDER — WITCH HAZEL-GLYCERIN EX PADS: 1.0000 "application " | MEDICATED_PAD | CUTANEOUS | Status: DC | PRN

## 2011-09-22 NOTE — Anesthesia Postprocedure Evaluation (Signed)
  Anesthesia Post-op Note  Patient: Katrina Carter  Procedure(s) Performed: * No procedures listed *  Patient Location: Mother/Baby  Anesthesia Type: Epidural  Level of Consciousness: awake, alert  and oriented  Airway and Oxygen Therapy: Patient Spontanous Breathing  Post-op Pain: none  Post-op Assessment: Patient's Cardiovascular Status Stable and Respiratory Function Stable  Post-op Vital Signs: stable  Complications: No apparent anesthesia complications

## 2011-09-23 LAB — CBC
Hemoglobin: 9.5 g/dL — ABNORMAL LOW (ref 12.0–15.0)
MCH: 33.2 pg (ref 26.0–34.0)
Platelets: 169 10*3/uL (ref 150–400)
RBC: 2.86 MIL/uL — ABNORMAL LOW (ref 3.87–5.11)
WBC: 10.2 10*3/uL (ref 4.0–10.5)

## 2011-09-23 MED ORDER — IBUPROFEN 600 MG PO TABS: 600.0000 mg | ORAL_TABLET | Freq: Four times a day (QID) | ORAL | Status: AC

## 2011-09-23 NOTE — Progress Notes (Signed)
Post Partum Day 1 Subjective: no complaints, up ad lib, voiding and tolerating PO; breast/bottle; desires OCPs  Objective: Blood pressure 95/59, pulse 78, temperature 98.1 F (36.7 C), temperature source Oral, resp. rate 18, height 4' 10.5" (1.486 m), weight 79.379 kg (175 lb), SpO2 98.00%, unknown if currently breastfeeding.  Physical Exam:  General: alert, cooperative and appears stated age Lochia: appropriate Uterine Fundus: firm Incision: n/a DVT Evaluation: No evidence of DVT seen on physical exam. Negative Homan's sign.  Basename 09/23/11 0510 09/21/11 1650  HGB 9.5* 12.8  HCT 28.2* 37.4    Assessment/Plan: Plan for discharge tomorrow   LOS: 2 days   [facility] 09/23/2011, 8:05 AM

## 2011-09-23 NOTE — Discharge Summary (Signed)
Obstetric Discharge Summary Reason for Admission:  Pt presented with vaginal bleeding, oligohydramnios by U/S.  IOL for oligo.   Prenatal Procedures: ultrasound Intrapartum Procedures: spontaneous vaginal delivery Postpartum Procedures: none Complications-Operative and Postpartum: none Hemoglobin  Date Value Range Status  09/23/2011 9.5* 12.0 - 15.0 g/dL Final     DELTA CHECK NOTED     REPEATED TO VERIFY     HCT  Date Value Range Status  09/23/2011 28.2* 36.0 - 46.0 % Final   Pt denies h/a, dizziness, or n/v.  Has ambulated without difficulty and no problems with urination.    Physical Exam:  BP 95/59  Pulse 78  Temp 98.1 F (36.7 C) (Oral)  Resp 18  Ht 4' 10.5" (1.486 m)  Wt 79.379 kg (175 lb)  BMI 35.95 kg/m2  SpO2 98%  Breastfeeding? Unknown  General: alert, cooperative and no distress Lochia: appropriate, scant Uterine Fundus: firm, -2 Incision: N/A DVT Evaluation: No evidence of DVT seen on physical exam. Negative Homan's sign. No cords or calf tenderness. No significant calf/ankle edema.  Discharge Diagnoses: Term Pregnancy-delivered  Discharge Information: Date: 09/23/2011 Activity: pelvic rest Diet: routine Medications: Ibuprofen Condition: stable Instructions: refer to practice specific booklet Discharge to: home   Newborn Data: Live born female  Birth Weight: 6 lb 10.2 oz (3010 g) APGAR: 8, 9  Home with mother.    All communication for discharge completed with hospital Spanish translator.  LEFTWICH-KIRBY, Anaira Seay 09/23/2011, 1:18 PM

## 2011-09-23 NOTE — Discharge Instructions (Signed)
Cuidados luego de un parto por va vaginal (Postpartum Care After Vaginal Delivery) Tia Alert del nacimiento del beb deber permanecer en el hospital durante 24 a 72 horas, excepto que hubiera existido algn problema, o usted sufra alguna enfermedad. Mientras se encuentre en el hospital recibir ayuda e instrucciones por parte de las enfermeras y el mdico, quienes cuidarn de usted y su beb y Chief Executive Officer darn consejos para Metallurgist, especialmente si es Financial risk analyst hijo.  En caso de ser necesario, le prescribirn analgsicos. Observar una pequea hemorragia vaginal y deber cambiar los apsitos con frecuencia. Lvese las manos cuidadosamente con agua y jabn durante al menos 20 segundos luego de cambiarse el apsito o ir al bao. Si elimina cogulos o aumenta la hemorragia, infrmelo a la enfermera. No deseche los cogulos sanguneos antes de mostrrselos a la enfermera, para asegurarse de que no es tejido Geologist, engineering. Si le han colocado una va intravenosa, se la retirarn dentro de las 24 horas, si no hay problemas. La primera vez que se levante de la cama o tome una ducha, llame a la enfermera para que la ayude que puede sentirse dbil, mareada o [place]. Si est amamantando, puede sentir contracciones dolorosas en el tero durante algunas semanas. Esto es normal y Medical sales representative, ya que de este modo el tero vuelve a su tamao normal. Si no est amamantando, utilice un sostn de soporte y trate de no tocarse las [employer] que haya dejado de producir Veguita. No deben administrarse hormonas para suprimir la [place], debido a que pueden causar cogulos sanguneos. Podr seguir una dieta normal, excepto que sufra diabetes o presente otros problemas de [place].  La enfermera colocar bolsas con hielo en el sitio de la episiotoma (agrandamiento quirrgico de la apertura vaginal) para reducir Chief Technology Officer y la hinchazn. En algunos casos raros hay dificultad para orinar, entonces la enfermera deber vaciarle la  vejiga con un catter. Si le han practicado una ligadura tubaria durante el posparto ("trompas atadas", esterilizacin femenina), esto no har que permanezca ms [employer] hospital. Podr tener al beb en su habitacin todo el tiempo que lo desee si el beb no tiene ningn problema. Lleve y traiga al beb de la nursery dentro de la [place]. No lo lleve en brazos. No abandone el rea de posparto. Si la madre es Rh negativa (falta de una protena en los glbulos rojos) y el beb es Rh positivo, la madre debe aplicarse la vacuna RhoGam para evitar problemas con el factor Rh en futuros embarazos Le darn instrucciones por escrito para usted y el beb y los medicamentos necesarios cuando reciba el alta mdica. Asegrese que comprende y sigue las indicaciones. INSTRUCCIONES PARA EL CUIDADO DOMICILIARIO  Siga las instrucciones y tome los medicamentos que le indicaron cuando le dieron el alta mdica.   Utilice los medicamentos de venta libre o de prescripcin para Chief Technology Officer, Environmental health practitioner o la [place], segn se lo indique el profesional que lo asiste.   No tome aspirina, ya que puede causar hemorragias.   Aumente sus actividades un poco cada da para tener ms fuerza y Hydrographic surveyor.   No beba alcohol, especialmente si est amamantando o toma analgsicos.   Tmese la [employer] veces por da y Engineering geologist.   Podr tener una pequea hemorragia durante 2 a 4 semanas. Esto es normal.   No utilice tampones o duchas vaginales, use toallas higinicas.   Trate de que Therapist, nutritional con usted y la ayude durante los primeros das en el hogar.  Descanse o duerma una siesta cuando el beb duerma.   Si est amamantando, use un buen sostn. Si no est amamantando, use un buen sostn y no estimule los pezones.   Consuma una dieta sana y siga tomando las vitaminas prenatales.   No conduzca vehculos, no realice actividades pesadas ni viaje hasta que su mdico la autorice.   No mantenga relaciones  sexuales hasta que el mdico lo permita.   Consulte con el profesional cuando puede comenzar a Education officer, environmental actividad fsica y que tipo de Glass blower/designer.   Comunquese inmediatamente con el mdico si tiene problemas luego del [place].   Comunquese con el pediatra si tiene problemas con el beb.   Programe su visita de control luego del parto y cmplala.  SOLICITE ATENCIN MDICA SI:  La temperatura se eleva por encima de 100 F (37.8 C).   Aumenta la hemorragia vaginal o elimina cogulos. Conserve algunos cogulos para mostrrselos al mdico.   Anola Gurney sangre o siente dolor al Geographical information systems officer.   Presenta secrecin vaginal con olor ftido.   Aumenta el dolor o la inflamacin en el sitio de la episiotoma (agrandamiento quirrgico de la apertura vaginal).   Sufre una cefalea grave.   Se siente deprimida.   La incisin se abre.   Se siente mareada o sufre un desmayo.   Aparece una erupcin cutnea.   Tiene una reaccin o problemas con su medicamento.   Siente dolor u observa enrojecimiento e hinchazn en el sitio de la va intravenosa.  SOLICITE ATENCIN MDICA DE INMEDIATO SI:  Siente dolor en el pecho.   Comienza a sentir falta de aire.   Se desmaya.   Siente dolor, con o sin hinchazn e irritacin en la pierna.   Tiene una hemorragia vaginal abundante, con o sin cogulos   IT consultant.   Brett Fairy secrecin vaginal con mal olor.  ASEGURESE QUE:   Comprende estas instrucciones.   Controlar su enfermedad.   Solicitar ayuda de inmediato si no mejora o empeora.  Document Released: 01/07/2007 Document Revised: 03/01/2011 [facility] Patient Information 2012 [place], [place].  Amamantar al beb (Breastfeeding) LOS BENEFICIOS DE AMAMANTAR Para el beb  La primera leche (calostro ) ayuda al mejor funcionamiento del sistema digestivo del beb.   La leche tiene anticuerpos que provienen de la madre y que ayudan a prevenir las infecciones en el beb.    Hay una menor incidencia de asma, enfermedades alrgicas y SMSI (sndrome de muerte sbita nfantil).   Los nutrientes que contiene la [place] materna son mejores que las frmulas para el bibern y favorecen el desarrollo cerebral.   Los bebs amamantados sufren menos gases, clicos y constipacin.  Para la mam  La lactancia materna favorece el desarrollo de un vnculo muy especial entre la madre y el beb.   Es ms conveniente, siempre disponible a la Optician, dispensing y ms econmica que la [employer].   Consume caloras en la madre y la ayuda a perder el peso ganado durante el [place].   Favorece la contraccin del tero a su tamao normal, de manera ms rpida y [employer] las hemorragias luego del [place].   Las [employer].C[employer] Holdings que amamantan tienen menor riesgo de Geophysical data processor de mama.  AMAMNTELO CON FRECUENCIA  Un beb sano, nacido a trmino, puede amamantarse con tanta frecuencia como cada hora, o espaciar las comidas cada tres horas.   Esta frecuencia variar de un beb a otro. Observe al beb cuando manifieste signos de hambre, antes que regirse  por el reloj.   Amamntelo tan seguido como el beb lo solicite, o cuando usted sienta la necesidad de Paramedic sus [place].   Despierte al beb si han pasado 3  4 horas desde la ltima comida.   El amamantamiento frecuente la ayudar a producir ms [place] y a Education officer, community de Engineer, mining en los pezones e hinchazn de las [place].  LA POSICIN DEL BEB PARA AMAMANTARLO  Ya sea que se encuentre acostada o sentada, asegrese que el abdomen del beb enfrente el suyo.   Sostenga la mama con el pulgar por arriba y el resto de los dedos por debajo. Asegrese que sus dedos se encuentren lejos del pezn y de la boca del beb.   Toque suavemente los labios del beb y la mejilla ms cercana a la mama con el dedo o el pezn.   Cuando la boca del beb se abra lo suficiente, introduzca el pezn y la zona oscura que lo rodea tanto como le sea  posible dentro de la boca.   Coloque a beb cerca suyo de modo que su nariz y mejillas toquen las mamas al [employer].  LAS COMIDAS  La duracin de cada comida vara de un beb a otro y de [place] comida a [place].   El beb debe succionar alrededor American Financial o tres minutos para que le llegue [place]. Esto se denomina "bajada". Por este motivo, permita que el nio se alimente en cada mama todo lo que desee. Terminar de mamar cuando haya recibido la cantidad [place] de nutrientes.   Para detener la succin coloque su dedo en la comisura de la boca del nio y Midwife entre sus encas antes de quitarle la mama de la boca. Esto la ayudar a English as a second language teacher.  REDUCIR LA CONGESTIN DE LAS MAMAS  Durante la primera semana despus del parto, usted puede experimentar Monsanto Company. Cuando las mamas estn congestionadas, se sienten calientes, llenas y molestas al tacto. Puede reducir la congestin si:   Lo amamanta frecuentemente, cada 2-3 horas. Las mams que [employer]W Corporation pronto y con frecuencia tienen menos problemas de [place].   Coloque bolsas fras livianas entre cada [place]. Esto ayuda a Building services engineer. Envuelva las bolsas de hielo en una toalla liviana para proteger su piel.   Aplique compresas hmedas calientes Wm[employer] W[employer]ley Jr[employer] Company durante 5 a 10 minutos antes de amamantar al [employer]. Esto aumenta la circulacin y [place] a que la [place].   Masajee suavemente la mama antes y Psychologist, sport and exercise.   Asegrese que el nio vaca al menos una mama antes de cambiar de lado.   Use un sacaleche para vaciar la mama si el beb se duerme o no se alimenta bien. Tambin podr P[employer] con esta bomba si tiene que volver al trabajo o siente que las mamas estn congestionadas.   Evite los biberones, chupetes o complementar la alimentacin con agua o jugos en lugar de la [place].   Verifique que el beb se encuentra en la posicin correcta mientras lo alimenta.   Evite el  cansancio, el estrs y la anemia   Use un soutien que sostenga bien sus mamas y evite los que tienen aro.   Consuma una dieta balanceada y beba lquidos en cantidad.  Si sigue estas indicaciones, la congestin debe mejorar en 24 a 48 horas. Si an tiene dificultades, consulte a Barista. TENDR SUFICIENTE LECHE MI BEB? Algunas veces las madres se preocupan acerca de si  sus bebs tendrn la leche suficiente. Puede asegurarse que el beb tiene la leche suficiente si:  El beb succiona y escucha que traga activamente.   El nio se alimenta al menos 8 a 12 veces en 24 horas. Alimntelo hasta que se desprenda por sus propios medios o se quede dormido en la primera mama (al menos durante 10 a 20 minutos), luego ofrzcale el otro lado.   El beb moja 5 a 6 paales descartables (6 a 8 paales de tela) en 24 horas cuando tiene 5  6 das de vida.   Tiene al menos 2-3 deposiciones todos los [employer] and Company primeros meses. La leche materna es todo el alimento que el beb necesita. No es necesario que el nio ingiera agua o preparados de bibern. De hecho, para ayudar a que sus mamas produzcan ms [place], lo mejor es no darle al beb suplementos durante las primeras semanas.   La materia fecal debe ser blanda y [place].   El beb debe aumentar 112 a 196 g por semana.  CUDESE Cuide sus mamas del siguiente modo:  Bese o dchese diariamente.   No lave sus pezones con jabn.   Comience a amamantar del lado izquierdo en una comida y del lado derecho en la siguiente.   Notar que [employer] suministro de [place] a los 2 a 5 [address]  [address] despus del [place]. Puede sentir algunas molestias por la congestin, lo que hace que sus mamas estn duras y sensibles. La congestin disminuye en 24 a 48 horas. Mientras tanto, aplique toallas hmedas calientes durante 5 a 10 minutos antes de amamantar. Un masaje suave y la extraccin de un poco de leche antes de Museum/gallery exhibitions officer ablandarn las mamas y har ms fcil que  el beb se agarre. Use un buen sostn y seque al aire los pezones durante 10 a 15 minutos luego de cada alimentacin.   Solo utilice apsitos de algodn.   Utilice lanolina [employer] pezones luego de [place]. No necesita lavarlos luego de alimentar al [employer].  Cudese del siguiente modo:   Consuma alimentos bien balanceados y refrigerios nutritivos.   Dixie Dials, jugos de fruta y agua para Warehouse manager sed (alrededor de 8 vasos por Futures trader).   Descanse lo suficiente.   Aumente la ingesta de calcio en la dieta (1200mg /da).   Evite los alimentos que usted nota que puedan afectar al beb.  SOLICITE ATENCIN MDICA SI:  Tiene preguntas que formular o dificultades con la alimentacin a pecho.   Necesita ayuda.   Observa una zona dura, roja y que le duele en la zona de la mama, y se acompaa de fiebre de 100.5 F (38.1 C) o ms.   El beb est muy somnoliento como para alimentarse bien o tiene problemas para dormir.   El beb moja menos de 6 paales por da, a partir de los [address] de [place].   La piel del beb o la parte blanca de sus ojos est ms amarilla de lo que estaba en el hospital.   Se siente deprimida.  Document Released: 03/12/2005 Document Revised: 03/01/2011 [facility] Patient Information 2012 [place], [place].

## 2011-09-23 NOTE — Progress Notes (Signed)
PSYCHOSOCIAL ASSESSMENT ~ MATERNAL/CHILD Name:   Katrina Carter       Age: 31 day    Referral Date: 09/22/2011   Reason/Source: Hx of depression during pregnancy/CN I. FAMILY/HOME ENVIRONMENT A. Child's Legal Guardian Parent(s)    Name:  Katrina Carter  DOB: [birth year]    Age: 25 Address:  [address] [place], [place] [address]  B. Other Household Members/Support Persons Elmer-69 year old brother          Maternal aunt, maternal uncle  C.   Other Support: extended family   II. PSYCHOSOCIAL DATA A. Information Source X Patient Interview   B. Financial and [employer] X Employment- Cleaning X Medicaid-  Mom will apply for herself and baby     X Food Stamps      X WIC   C. Cultural and Environment Information:  Cultural Issues Impacting Care-language barrier affecting maternal ability to access resources  III. STRENGTHS X Supportive family/friends   X Adequate Resources  X Home prepared for Child (including basic supplies)                 X Other- [facility]- Pediatrician  IV. RISK FACTORS AND CURRENT PROBLEMS        Maternal prenatal depression             V. SOCIAL WORK ASSESSMENT Met with MOB, baby and child's older brother at bedside.  MOB reports she had some sadness/depression during her pregnancy due to FOB choosing not to be involved in their lives.  She reports that she is overjoyed now that baby is here and denies any feelings of depression.  MOB reports having great support from her brothers and sisters.  She plans to pursue pediatric care at the same clinic where older brother receives care.  Big brother is very happy to have a new baby sister.  He says that he will help his mom take care of baby and assist in her recovery.  MOB does not report any needs or concerns at this time.  MOB plans to return to her cleaning job in a month or so.  Mom was happy, energetic, and looking forward to discharge home with her new baby.   VI. SOCIAL WORK  PLAN X No Further Intervention Required/No Barriers to Discharge  Staci Acosta, MSW LCSW, 09/23/2011, 3:48 pm

## 2011-09-24 NOTE — Progress Notes (Signed)
Post discharge chart review completed.  

## 2011-09-25 NOTE — MAU Provider Note (Signed)
Agree with above note.  Katrina Carter H. 09/25/2011 8:13 PM

## 2011-09-25 NOTE — H&P (Signed)
Agree with above note.  Katrina Mathieson H. 09/25/2011 8:13 PM  

## 2011-09-28 NOTE — Discharge Summary (Signed)
Attestation of Attending Supervision of Advanced Practitioner: Evaluation and management procedures were performed by the PA/NP/CNM/OB Fellow under my supervision/collaboration. Chart reviewed and agree with management and plan.  Talani Brazee V 09/28/2011 7:54 AM

## 2013-03-26 NOTE — L&D Delivery Note (Signed)
i agree with above assessment

## 2013-03-26 NOTE — L&D Delivery Note (Signed)
Delivery Note At 8:29 AM a viable female was delivered via  (Presentation:Occiputposterior  ).  APGAR: 9, 9; weight: pending    Placenta status: intact, .  Cord:3 vessel  with the following complications: marginal insertion .  Cord pH: pending   The Kiwi was used and popped off twice.   Anesthesia: None  Episiotomy: None Est. Blood Loss (mL): 400  Mom to postpartum.  Baby to Couplet care / Skin to Skin.  Myra RudeJeremy E Schmitz 08/10/2013, 8:42 AM

## 2013-06-01 ENCOUNTER — Other Ambulatory Visit (HOSPITAL_COMMUNITY): Payer: Self-pay | Admitting: Physician Assistant

## 2013-06-01 DIAGNOSIS — Z3689 Encounter for other specified antenatal screening: Secondary | ICD-10-CM

## 2013-06-01 LAB — OB RESULTS CONSOLE HIV ANTIBODY (ROUTINE TESTING): HIV: NONREACTIVE

## 2013-06-01 LAB — OB RESULTS CONSOLE HEPATITIS B SURFACE ANTIGEN: Hepatitis B Surface Ag: NEGATIVE

## 2013-06-01 LAB — OB RESULTS CONSOLE ANTIBODY SCREEN: ANTIBODY SCREEN: NEGATIVE

## 2013-06-01 LAB — OB RESULTS CONSOLE GC/CHLAMYDIA
Chlamydia: NEGATIVE
GC PROBE AMP, GENITAL: NEGATIVE

## 2013-06-01 LAB — OB RESULTS CONSOLE RPR: RPR: NONREACTIVE

## 2013-06-01 LAB — OB RESULTS CONSOLE ABO/RH: RH Type: POSITIVE

## 2013-06-01 LAB — OB RESULTS CONSOLE RUBELLA ANTIBODY, IGM: Rubella: IMMUNE

## 2013-06-08 ENCOUNTER — Encounter (HOSPITAL_COMMUNITY): Payer: Self-pay

## 2013-06-08 ENCOUNTER — Ambulatory Visit (HOSPITAL_COMMUNITY)
Admission: RE | Admit: 2013-06-08 | Discharge: 2013-06-08 | Disposition: A | Discharge status: Home / Self Care | Payer: Medicaid Other | Source: Ambulatory Visit | Attending: Physician Assistant | Admitting: Physician Assistant

## 2013-06-08 ENCOUNTER — Other Ambulatory Visit (HOSPITAL_COMMUNITY): Payer: Self-pay | Admitting: Physician Assistant

## 2013-06-08 DIAGNOSIS — O4440 Low lying placenta NOS or without hemorrhage, unspecified trimester: Secondary | ICD-10-CM

## 2013-06-08 DIAGNOSIS — Z3689 Encounter for other specified antenatal screening: Secondary | ICD-10-CM

## 2013-06-08 DIAGNOSIS — O0930 Supervision of pregnancy with insufficient antenatal care, unspecified trimester: Secondary | ICD-10-CM | POA: Insufficient documentation

## 2013-06-11 ENCOUNTER — Other Ambulatory Visit (HOSPITAL_COMMUNITY): Payer: Self-pay | Admitting: Physician Assistant

## 2013-06-11 DIAGNOSIS — Z0489 Encounter for examination and observation for other specified reasons: Secondary | ICD-10-CM

## 2013-06-11 DIAGNOSIS — Z3689 Encounter for other specified antenatal screening: Secondary | ICD-10-CM

## 2013-06-11 DIAGNOSIS — IMO0002 Reserved for concepts with insufficient information to code with codable children: Secondary | ICD-10-CM

## 2013-07-20 ENCOUNTER — Other Ambulatory Visit (HOSPITAL_COMMUNITY): Payer: Self-pay | Admitting: Physician Assistant

## 2013-07-20 ENCOUNTER — Ambulatory Visit (HOSPITAL_COMMUNITY)
Admission: RE | Admit: 2013-07-20 | Discharge: 2013-07-20 | Disposition: A | Discharge status: Home / Self Care | Payer: Medicaid Other | Source: Ambulatory Visit | Attending: Physician Assistant | Admitting: Physician Assistant

## 2013-07-20 DIAGNOSIS — Z1389 Encounter for screening for other disorder: Secondary | ICD-10-CM | POA: Insufficient documentation

## 2013-07-20 DIAGNOSIS — Z3689 Encounter for other specified antenatal screening: Secondary | ICD-10-CM

## 2013-07-20 DIAGNOSIS — IMO0002 Reserved for concepts with insufficient information to code with codable children: Secondary | ICD-10-CM

## 2013-07-20 DIAGNOSIS — Z0489 Encounter for examination and observation for other specified reasons: Secondary | ICD-10-CM

## 2013-07-20 DIAGNOSIS — Z363 Encounter for antenatal screening for malformations: Secondary | ICD-10-CM | POA: Insufficient documentation

## 2013-08-10 ENCOUNTER — Inpatient Hospital Stay (HOSPITAL_COMMUNITY)
Admission: AD | Admit: 2013-08-10 | Discharge: 2013-08-11 | DRG: 775 | Disposition: A | Discharge status: Home / Self Care | Payer: Medicaid Other | Source: Ambulatory Visit | Attending: Obstetrics & Gynecology | Admitting: Obstetrics & Gynecology

## 2013-08-10 ENCOUNTER — Encounter (HOSPITAL_COMMUNITY): Payer: Self-pay

## 2013-08-10 DIAGNOSIS — O479 False labor, unspecified: Secondary | ICD-10-CM | POA: Diagnosis present

## 2013-08-10 LAB — CBC
HCT: 39.5 % (ref 36.0–46.0)
HEMOGLOBIN: 13.8 g/dL (ref 12.0–15.0)
MCH: 34.4 pg — AB (ref 26.0–34.0)
MCHC: 34.9 g/dL (ref 30.0–36.0)
MCV: 98.5 fL (ref 78.0–100.0)
PLATELETS: 173 10*3/uL (ref 150–400)
RBC: 4.01 MIL/uL (ref 3.87–5.11)
RDW: 12.9 % (ref 11.5–15.5)
WBC: 12.2 10*3/uL — ABNORMAL HIGH (ref 4.0–10.5)

## 2013-08-10 LAB — TYPE AND SCREEN
ABO/RH(D): O POS
Antibody Screen: NEGATIVE

## 2013-08-10 LAB — RPR

## 2013-08-10 LAB — GROUP B STREP BY PCR: Group B strep by PCR: NEGATIVE

## 2013-08-10 MED ORDER — DIBUCAINE 1 % RE OINT: 1.0000 "application " | TOPICAL_OINTMENT | RECTAL | Status: DC | PRN

## 2013-08-10 MED ORDER — LANOLIN HYDROUS EX OINT: TOPICAL_OINTMENT | CUTANEOUS | Status: DC | PRN

## 2013-08-10 MED ORDER — OXYTOCIN 40 UNITS IN LACTATED RINGERS INFUSION - SIMPLE MED
1.0000 m[IU]/min | INTRAVENOUS | Status: DC
  Administered 2013-08-10: 2 m[IU]/min via INTRAVENOUS

## 2013-08-10 MED ORDER — MISOPROSTOL 200 MCG PO TABS
ORAL_TABLET | ORAL | Status: AC
  Filled 2013-08-10: qty 5

## 2013-08-10 MED ORDER — LIDOCAINE HCL (PF) 1 % IJ SOLN
30.0000 mL | INTRAMUSCULAR | Status: DC | PRN
  Filled 2013-08-10: qty 30

## 2013-08-10 MED ORDER — IBUPROFEN 600 MG PO TABS
600.0000 mg | ORAL_TABLET | Freq: Four times a day (QID) | ORAL | Status: DC
  Administered 2013-08-10 – 2013-08-11 (×4): 600 mg via ORAL
  Filled 2013-08-10 (×4): qty 1

## 2013-08-10 MED ORDER — SIMETHICONE 80 MG PO CHEW
80.0000 mg | CHEWABLE_TABLET | ORAL | Status: DC | PRN
Start: 2013-08-10 — End: 2013-08-11

## 2013-08-10 MED ORDER — PRENATAL MULTIVITAMIN CH: 1.0000 | ORAL_TABLET | Freq: Every day | ORAL | Status: DC

## 2013-08-10 MED ORDER — FENTANYL CITRATE 0.05 MG/ML IJ SOLN
100.0000 ug | INTRAMUSCULAR | Status: DC | PRN
  Administered 2013-08-10 (×2): 100 ug via INTRAVENOUS
  Filled 2013-08-10 (×2): qty 2

## 2013-08-10 MED ORDER — ACETAMINOPHEN 325 MG PO TABS: 650.0000 mg | ORAL_TABLET | ORAL | Status: DC | PRN

## 2013-08-10 MED ORDER — MISOPROSTOL 200 MCG PO TABS
ORAL_TABLET | ORAL | Status: AC
  Filled 2013-08-10: qty 1

## 2013-08-10 MED ORDER — TETANUS-DIPHTH-ACELL PERTUSSIS 5-2.5-18.5 LF-MCG/0.5 IM SUSP: 0.5000 mL | Freq: Once | INTRAMUSCULAR | Status: DC

## 2013-08-10 MED ORDER — MISOPROSTOL 200 MCG PO TABS
1000.0000 ug | ORAL_TABLET | Freq: Once | ORAL | Status: AC
  Administered 2013-08-10: 1000 ug via RECTAL

## 2013-08-10 MED ORDER — OXYCODONE-ACETAMINOPHEN 5-325 MG PO TABS: 1.0000 | ORAL_TABLET | ORAL | Status: DC | PRN

## 2013-08-10 MED ORDER — CITRIC ACID-SODIUM CITRATE 334-500 MG/5ML PO SOLN: 30.0000 mL | ORAL | Status: DC | PRN

## 2013-08-10 MED ORDER — WITCH HAZEL-GLYCERIN EX PADS: 1.0000 "application " | MEDICATED_PAD | CUTANEOUS | Status: DC | PRN

## 2013-08-10 MED ORDER — ONDANSETRON HCL 4 MG/2ML IJ SOLN: 4.0000 mg | Freq: Four times a day (QID) | INTRAMUSCULAR | Status: DC | PRN

## 2013-08-10 MED ORDER — ZOLPIDEM TARTRATE 5 MG PO TABS: 5.0000 mg | ORAL_TABLET | Freq: Every evening | ORAL | Status: DC | PRN

## 2013-08-10 MED ORDER — SODIUM CHLORIDE 0.9 % IV SOLN
2.0000 g | Freq: Four times a day (QID) | INTRAVENOUS | Status: DC
  Administered 2013-08-10: 2 g via INTRAVENOUS
  Filled 2013-08-10 (×2): qty 2000

## 2013-08-10 MED ORDER — LACTATED RINGERS IV SOLN: 500.0000 mL | INTRAVENOUS | Status: DC | PRN

## 2013-08-10 MED ORDER — OXYTOCIN 40 UNITS IN LACTATED RINGERS INFUSION - SIMPLE MED: 62.5000 mL/h | INTRAVENOUS | Status: DC

## 2013-08-10 MED ORDER — TERBUTALINE SULFATE 1 MG/ML IJ SOLN: 0.2500 mg | Freq: Once | INTRAMUSCULAR | Status: DC | PRN

## 2013-08-10 MED ORDER — PRENATAL MULTIVITAMIN CH
1.0000 | ORAL_TABLET | Freq: Every day | ORAL | Status: DC
  Administered 2013-08-10 – 2013-08-11 (×2): 1 via ORAL
  Filled 2013-08-10 (×2): qty 1

## 2013-08-10 MED ORDER — OXYCODONE-ACETAMINOPHEN 5-325 MG PO TABS
1.0000 | ORAL_TABLET | ORAL | Status: DC | PRN
  Administered 2013-08-10 – 2013-08-11 (×4): 1 via ORAL
  Filled 2013-08-10 (×3): qty 1
  Filled 2013-08-10: qty 2

## 2013-08-10 MED ORDER — SENNOSIDES-DOCUSATE SODIUM 8.6-50 MG PO TABS
2.0000 | ORAL_TABLET | ORAL | Status: DC
  Administered 2013-08-10: 2 via ORAL
  Filled 2013-08-10: qty 2

## 2013-08-10 MED ORDER — OXYTOCIN BOLUS FROM INFUSION: 500.0000 mL | INTRAVENOUS | Status: DC

## 2013-08-10 MED ORDER — FLEET ENEMA 7-19 GM/118ML RE ENEM: 1.0000 | ENEMA | RECTAL | Status: DC | PRN

## 2013-08-10 MED ORDER — IBUPROFEN 600 MG PO TABS
600.0000 mg | ORAL_TABLET | Freq: Four times a day (QID) | ORAL | Status: DC | PRN
  Administered 2013-08-10: 600 mg via ORAL
  Filled 2013-08-10: qty 1

## 2013-08-10 MED ORDER — LIDOCAINE HCL (PF) 1 % IJ SOLN
INTRAMUSCULAR | Status: AC
  Filled 2013-08-10: qty 30

## 2013-08-10 MED ORDER — BENZOCAINE-MENTHOL 20-0.5 % EX AERO
1.0000 "application " | INHALATION_SPRAY | CUTANEOUS | Status: DC | PRN
  Administered 2013-08-10: 1 via TOPICAL
  Filled 2013-08-10: qty 56

## 2013-08-10 MED ORDER — DIPHENHYDRAMINE HCL 25 MG PO CAPS: 25.0000 mg | ORAL_CAPSULE | Freq: Four times a day (QID) | ORAL | Status: DC | PRN

## 2013-08-10 MED ORDER — ONDANSETRON HCL 4 MG/2ML IJ SOLN: 4.0000 mg | INTRAMUSCULAR | Status: DC | PRN

## 2013-08-10 MED ORDER — ONDANSETRON HCL 4 MG PO TABS: 4.0000 mg | ORAL_TABLET | ORAL | Status: DC | PRN

## 2013-08-10 MED ORDER — LACTATED RINGERS IV SOLN
INTRAVENOUS | Status: DC
  Administered 2013-08-10: 06:00:00 via INTRAVENOUS

## 2013-08-10 MED ORDER — OXYTOCIN 40 UNITS IN LACTATED RINGERS INFUSION - SIMPLE MED
INTRAVENOUS | Status: AC
  Filled 2013-08-10: qty 1000

## 2013-08-10 NOTE — H&P (Signed)
Katrina Carter is a 33 y.o. female presenting for onset of labor.  History Patient is a 33 yo female G3P2002 at 37.3 weeks presenting with onset of labor. She notes cramping and contractions began yesterday. She denies loss of fluid. She notes a small amount of discharge with small amount of blood mixed in. She notes + fetal movement.  OB History   Grav Para Term Preterm Abortions TAB SAB Ect Mult Living   3 2 2  0 0 0 0 0 0 2     Past Medical History  Diagnosis Date  . Abdominal pain   . Cholelithiasis    Past Surgical History  Procedure Laterality Date  . No past surgeries     Family History: family history is negative for Anesthesia problems, Hypotension, Malignant hyperthermia, and Pseudochol deficiency. Social History:  reports that she has never smoked. She has never used smokeless tobacco. She reports that she does not drink alcohol or use illicit drugs.   Prenatal Transfer Tool  Maternal Diabetes: No Genetic Screening: Declined Maternal Ultrasounds/Referrals: Abnormal:  Findings:   Other: Initial US with low lying placenta, repeat with posterior placenta above the os Fetal Ultrasounds or other Referrals:  None Maternal Substance Abuse:  No Significant Maternal Medications:  None Significant Maternal Lab Results:  Lab values include: Other: GBS unknown Other Comments:  None  ROS see HPI  Dilation: 7 Effacement (%): 90;80 Station: -2 Exam by:: Rudi CocoM. Robb RN Blood pressure 135/74, pulse 92, resp. rate 20, height 4\' 11"  (1.499 m), weight 79.017 kg (174 lb 3.2 oz), last menstrual period 11/21/2012, unknown if currently breastfeeding. Exam Physical Exam  Constitutional: She appears well-developed and well-nourished.  HENT:  Head: Normocephalic and atraumatic.  GI: Soft. There is no tenderness.  Gravid   Musculoskeletal: She exhibits no edema.  Skin: Skin is warm and dry.    Fetal heart monitoring: FHR 150, moderate variability, accels present, no decels,  contractions every 2-3 minutes  Prenatal labs: ABO, Rh: O/Positive/-- (03/09 0000) Antibody: Negative (03/09 0000) Rubella: Immune (03/09 0000) RPR: Nonreactive (03/09 0000)  HBsAg: Negative (03/09 0000)  HIV: Non-reactive (03/09 0000)  GBS:   unknown  Assessment/Plan: Patient is a 33 yo female G3P2002 at 37.3 weeks who presents in active labor.  Will admit to L&D. Fentanyl for pain. Ampicillin for GBS unknown. Anticipate NSVD.  Glori Luisric G Nikki Rusnak 08/10/2013, 6:14 AM

## 2013-08-10 NOTE — Lactation Note (Signed)
This note was copied from the chart of Katrina Carter. Lactation Consultation Note  Patient Name: Katrina Carter [id number]'s Date: 08/10/2013 Reason for consult: Initial assessment of this multipara who speaks Spanish and her newborn at 5913 hours of age.  Mom informs this LC (able to speak and understand basic Spanish) that she did not breastfeed first 2 children.  Per mom, she has been shown how to express her colostrum by hand and states she has "a little" but LC reminds her that her milk is perfect for baby and encouraged cue feedings and frequent STS.  LC also cautioned mom to avoid formula during first 2 weeks while baby is learning to breastfeed. LC encouraged review of Baby and Me pp 13-16 for review of BF information in [place].LC provided Pacific MutualLC Resource brochure in Spanish, and reviewed WH services and list of community and web site resources, especially [facility] website which has information available in [place]..    Maternal Data Formula Feeding for Exclusion: Yes Reason for exclusion: Mother's choice to formula and breast feed on admission Infant to breast within first hour of birth: Yes (initial LATCH score=8; baby nursed for 20 minutes)  Feeding Feeding Type: Breast Fed  LATCH Score/Interventions Latch: Grasps breast easily, tongue down, lips flanged, rhythmical sucking. Intervention(s): Adjust position  Audible Swallowing: A few with stimulation  Type of Nipple: Everted at rest and after stimulation  Comfort (Breast/Nipple): Soft / non-tender     Hold (Positioning): No assistance needed to correctly position infant at breast.  LATCH Score: 9 (most recent feeding assessment by RN)  Lactation Tools Discussed/Used   STS, hand expression, cue feedings  Consult Status Consult Status: Follow-up Date: 08/11/13 Follow-up type: In-patient    Zara ChessJoanne P Brynnly Bonet 08/10/2013, 9:42 PM

## 2013-08-10 NOTE — Progress Notes (Signed)
Ur chart review completed.  

## 2013-08-10 NOTE — Progress Notes (Signed)
Laurey ArrowGriselda MARTINEZ TORRES is a 33 y.o. G3P2002 at 6546w3d  admitted for active labor  Subjective: Patient reports discomfort with contractions.  Objective: BP 120/78  Pulse 91  Temp(Src) 98.9 F (37.2 C) (Oral)  Resp 22  Ht 4\' 11"  (1.499 m)  Wt 78.926 kg (174 lb)  BMI 35.13 kg/m2  LMP 11/21/2012      FHT:  FHR: 140 bpm, variability: moderate,  accelerations:  Present,  decelerations:  Absent UC:   regular, every 3 minutes SVE:   Dilation: 6 Effacement (%): 80 Station: -1 Exam by:: OmnicareLeanne RN   Labs: Lab Results  Component Value Date   WBC 12.2* 08/10/2013   HGB 13.8 08/10/2013   HCT 39.5 08/10/2013   MCV 98.5 08/10/2013   PLT 173 08/10/2013    Assessment / Plan: Spontaneous labor, progressing normally, AROM   Labor: Progressing normally Fetal Wellbeing:  Category I Pain Control:  Fentanyl I/D:  n/a Anticipated MOD:  NSVD  Glori Luisric G Linnaea Ahn 08/10/2013, 7:49 AM

## 2013-08-10 NOTE — MAU Note (Signed)
Pt having contractions and cramping.

## 2013-08-11 MED ORDER — IBUPROFEN 600 MG PO TABS: 600.0000 mg | ORAL_TABLET | Freq: Four times a day (QID) | ORAL | Status: DC

## 2013-08-11 NOTE — Discharge Instructions (Signed)
Cuidados luego de un parto por vía vaginal °(Postpartum Care After Vaginal Delivery) °Luego del nacimiento del bebé deberá permanecer en el hospital durante 24 a 72 horas, excepto que hubiera existido algún problema, o usted sufra alguna enfermedad. Mientras se encuentre en el hospital recibirá ayuda e instrucciones por parte de las enfermeras y el médico, quienes cuidarán de usted y su bebé y le darán consejos para amamantarlo correctamente, especialmente si es el primer hijo.  °En caso de ser necesario, le prescribirán analgésicos. Observará una pequeña hemorragia vaginal y deberá cambiar los apósitos con frecuencia. Lávese las manos cuidadosamente con agua y jabón durante al menos 20 segundos luego de cambiarse el apósito o ir al baño. Si elimina coágulos o aumenta la hemorragia, infórmelo a la enfermera. No deseche los coágulos sanguíneos antes de mostrárselos a la enfermera, para asegurarse de que no es tejido placentario. °Si le han colocado una vía intravenosa, se la retirarán dentro de las 24 horas, si no hay problemas. La primera vez que se levante de la cama o tome una ducha, llame a la enfermera para que la ayude que puede sentirse débil, mareada o desmayarse. Si está amamantando, puede sentir contracciones dolorosas en el útero durante algunas semanas. Esto es normal y necesario, ya que de este modo el útero vuelve a su tamaño normal. Si no está amamantando, utilice un sostén de soporte y trate de no tocarse las mamas hasta que haya dejado de producir leche. No deben administrarse hormonas para suprimir la leche, debido a que pueden causar coágulos sanguíneos. Podrá seguir una dieta normal, excepto que sufra diabetes o presente otros problemas de salud.  °La enfermera colocará bolsas con hielo en el sitio de la episiotomía (agrandamiento quirúrgico de la apertura vaginal) para reducir el dolor y la hinchazón. En algunos casos raros hay dificultad para orinar, entonces la enfermera deberá vaciarle la  vejiga con un catéter. Si le han practicado una ligadura tubaria durante el posparto ("trompas atadas", esterilización femenina), esto no hará que permanezca más tiempo en el hospital. °Podrá tener al bebé en su habitación todo el tiempo que lo desee si el bebé no tiene ningún problema. Lleve y traiga al bebé de la nursery dentro de la cunita. No lo lleve en brazos. No abandone el área de posparto. Si la madre es Rh negativa (falta de una proteína en los glóbulos rojos) y el bebé es Rh positivo, la madre debe aplicarse la vacuna RhoGam para evitar problemas con el factor Rh en futuros embarazos °Le darán instrucciones por escrito para usted y el bebé y los medicamentos necesarios cuando reciba el alta médica. Asegúrese que comprende y sigue las indicaciones. °INSTRUCCIONES PARA EL CUIDADO DOMICILIARIO °· Siga las instrucciones y tome los medicamentos que le indicaron cuando le dieron el alta médica.  °· Utilice los medicamentos de venta libre o de prescripción para el dolor, el malestar o la fiebre, según se lo indique el profesional que lo asiste.  °· No tome aspirina, ya que puede causar hemorragias.  °· Aumente sus actividades un poco cada día para tener más fuerza y resistencia.  °· No beba alcohol, especialmente si está amamantando o toma analgésicos.  °· Tómese la temperatura dos veces por día y regístrela.  °· Podrá tener una pequeña hemorragia durante 2 a 4 semanas. Esto es normal.  °· No utilice tampones o duchas vaginales, use toallas higiénicas.  °· Trate de que alguna persona permanezca con usted y la ayude durante los primeros días en el hogar.  °·   Descanse o duerma una siesta cuando el bebé duerma.  °· Si está amamantando, use un buen sostén. Si no está amamantando, use un buen sostén y no estimule los pezones.  °· Consuma una dieta sana y siga tomando las vitaminas prenatales.  °· No conduzca vehículos, no realice actividades pesadas ni viaje hasta que su médico la autorice.  °· No mantenga relaciones  sexuales hasta que el médico lo permita.  °· Consulte con el profesional cuando puede comenzar a realizar actividad física y que tipo de ejercicios puede hacer.  °· Comuníquese inmediatamente con el médico si tiene problemas luego del parto.  °· Comuníquese con el pediatra si tiene problemas con el bebé.  °· Programe su visita de control luego del parto y cúmplala.  °SOLICITE ATENCIÓN MÉDICA SI: °· La temperatura se eleva por encima de 100° F (37.8° C).  °· Aumenta la hemorragia vaginal o elimina coágulos. Conserve algunos coágulos para mostrárselos al médico.  °· Observa sangre o siente dolor al orinar.  °· Presenta secreción vaginal con olor fétido.  °· Aumenta el dolor o la inflamación en el sitio de la episiotomía (agrandamiento quirúrgico de la apertura vaginal).  °· Sufre una cefalea grave.  °· Se siente deprimida.  °· La incisión se abre.  °· Se siente mareada o sufre un desmayo.  °· Aparece una erupción cutánea.  °· Tiene una reacción o problemas con su medicamento.  °· Siente dolor u observa enrojecimiento e hinchazón en el sitio de la vía intravenosa.  °SOLICITE ATENCIÓN MÉDICA DE INMEDIATO SI: °· Siente dolor en el pecho.  °· Comienza a sentir falta de aire.  °· Se desmaya.  °· Siente dolor, con o sin hinchazón e irritación en la pierna.  °· Tiene una hemorragia vaginal abundante, con o sin coágulos  °· Siente dolor en el estómago.  °· Observa una secreción vaginal con mal olor.  °ASEGURESE QUE:  °· Comprende estas instrucciones.  °· Controlará su enfermedad.  °· Solicitará ayuda de inmediato si no mejora o empeora.  °Document Released: 01/07/2007 Document Revised: 03/01/2011 °Exit[employer]® Patient Information ©2012 [employer]. °

## 2013-08-11 NOTE — Progress Notes (Signed)
Mother Discharged and understands baby is now a baby pt

## 2013-08-11 NOTE — Lactation Note (Signed)
This note was copied from the chart of Katrina Carter. Lactation Consultation Note [employer] Interpreter used.  Mother states since she has no milk she is giving baby formula and breastfeeding. Mother states she knows how to hand express.   Reviewed size of baby's stomach, supply and demand. Mother denies problems or questions. Encouraged her to call if she needs further assistance.   Patient Name: Katrina Carter ZOXWR'UToday's Date: 08/11/2013 Reason for consult: Follow-up assessment   Maternal Data    Feeding    LATCH Score/Interventions                      Lactation Tools Discussed/Used     Consult Status Consult Status: Follow-up Date: 08/12/13 Follow-up type: In-patient    [name] 08/11/2013, 1:32 PM

## 2013-08-11 NOTE — Progress Notes (Signed)
CSW met with pt briefly to address history of depression noted in her chart. Pt acknowledged situational depression in 2013, when the father of that child wanted pt to terminate pregnancy. Pts symptoms resolved quickly & did not require any medication or therapy. She lives with her cousin (who she describes as her primary support person) & 2 small children. She has all the necessary supplies for the infant & appears to be bonding well. This FOB, Jonathan is not involved. She denies any depressed mood since 2013. CSW discussed PP depression signs/symptoms with pt & encouraged her to seek medical attention if needed. CSW available to assist further as needed.      

## 2013-08-11 NOTE — Discharge Summary (Signed)
Obstetric Discharge Summary Reason for Admission: onset of labor Prenatal Procedures: none Intrapartum Procedures: spontaneous vaginal delivery and GBS prophylaxis Postpartum Procedures: none Complications-Operative and Postpartum: none Hemoglobin  Date Value Ref Range Status  08/10/2013 13.8  12.0 - 15.0 g/dL Final     HCT  Date Value Ref Range Status  08/10/2013 39.5  36.0 - 46.0 % Final    Discharge Diagnoses: Term Pregnancy-delivered  Delivery Note  At 8:29 AM a viable female was delivered via (Presentation:Occiputposterior ). APGAR: 9, 9; weight: pending  Placenta status: intact, . Cord:3 vessel with the following complications: marginal insertion . Cord pH: pending  The Kiwi was used and popped off twice.  Anesthesia: None  Episiotomy: None  Est. Blood Loss (mL): 400  Mom to postpartum. Baby to Couplet care / Skin to Skin.  Myra RudeJeremy E Schmitz  08/10/2013, 8:42 AM   Delivery Note  At 8:29 AM a viable female was delivered via (Presentation:Occiputposterior ). APGAR: 9, 9; weight: pending  Placenta status: intact, . Cord:3 vessel with the following complications: marginal insertion . Cord pH: pending  The Kiwi was used and popped off twice.  Anesthesia: None  Episiotomy: None  Est. Blood Loss (mL): 400  Mom to postpartum. Baby to Couplet care / Skin to Skin.  Myra RudeJeremy E Schmitz  08/10/2013, 8:42 AM   Physical Exam:  General: alert, cooperative and no distress Lochia: appropriate Uterine Fundus: firm DVT Evaluation: No evidence of DVT seen on physical exam. Negative Homan's sign. No cords or calf tenderness. No significant calf/ankle edema.  Discharge Information: Date: 08/11/2013 Activity: pelvic rest Diet: routine Medications: Motrin and prenatal vitamin Baby feeding: plans to breastfeed, plans to bottle feed Contraception: IUD Condition: stable Instructions: refer to practice specific booklet Discharge to: home   Newborn Data: Live born female  Birth Weight:  7 lb 0.3 oz (3184 g) APGAR: 9, 9  Home with mother.   Lawernce Pittsrew Venables PA-S2 08/11/2013, 8:57 AM  I examined pt and agree with documentation above and resident plan of care. Eino FarberWalidah Paul HalfN Muhammad, CNM

## 2013-08-12 ENCOUNTER — Ambulatory Visit: Payer: Self-pay

## 2013-08-12 NOTE — Lactation Note (Signed)
This note was copied from the chart of Katrina Carter. Lactation Consultation Note Mom states that she is not sure if baby is getting enough at the breast. States that she feels slight pain at beginning of latch, but it goes away as baby feeds. States that baby feeds frequently for about 20 minutes with audible swallows. Mom then feeds formula via bottle which baby takes well.  At this time, baby is being placed on phototherapy. Given that mom had planned to breast feed and formula feed from the start, my recommendation to mom is to continue doing what she is already doing - breast feed first, then offer formula with bottle as preferred. Instructed mom to call for assistance during breastfeeding if she has any concerns or discomfort. Mom verbalize understanding.  Eda, in-house interpreter used for consultation.   Patient Name: Katrina Carter ZOXWR'UToday's Date: 08/12/2013     Maternal Data    Feeding Feeding Type: Bottle Fed - Formula Nipple Type: Regular Length of feed: 15 min  LATCH Score/Interventions                      Lactation Tools Discussed/Used     Consult Status      Talmadge Coventrylizabeth F Desiraye Rolfson 08/12/2013, 11:55 AM

## 2013-08-13 ENCOUNTER — Ambulatory Visit: Payer: Self-pay

## 2013-08-13 NOTE — Lactation Note (Signed)
This note was copied from the chart of Katrina Carter. Lactation Consultation Note Eda unavailable.  [employer] Interpreter [phone] used. Mother breast and formula feeding. Reviewed supply and demand.  Mother states she puts baby to the breast first and she breastfeeds for 15-20 min and falls asleep. She states that then she wakes one hour later hungry so she gives her formula.  Suggested that each time baby shows feeding cues, mother should put her to the breast to build her milk supply. The more she puts her to the breast, the better her milk supply will be.  Reviewed hand expressing first.   Provided mother with a breast pump and encouraged her to call if further assistance is needed.   Patient Name: Katrina Carter EAVWU'JToday's Date: 08/13/2013 Reason for consult: Follow-up assessment   Maternal Data    Feeding Feeding Type: Breast Fed Length of feed: 15 min  LATCH Score/Interventions                      Lactation Tools Discussed/Used     Consult Status Consult Status: Complete    Hardie PulleyRuth Boschen Berkelhammer 08/13/2013, 11:35 AM

## 2014-01-25 ENCOUNTER — Encounter (HOSPITAL_COMMUNITY): Payer: Self-pay

## 2017-03-11 DIAGNOSIS — Z79899 Other long term (current) drug therapy: Secondary | ICD-10-CM | POA: Insufficient documentation

## 2017-03-11 DIAGNOSIS — K8020 Calculus of gallbladder without cholecystitis without obstruction: Secondary | ICD-10-CM | POA: Insufficient documentation

## 2017-03-11 NOTE — ED Triage Notes (Signed)
Patient is complaining of abd pain that started on Saturday. She is complaining of upper, right upper quad, and lower abd pain. [place] [facility] [place] and [place] Omeprazole. Patient reports taking one dose of Omeprazole and pain has returned after eating. Reports nausea and headache. Denies fever, diarrhea, or vomiting.

## 2017-03-12 ENCOUNTER — Emergency Department (HOSPITAL_COMMUNITY)
Admission: EM | Admit: 2017-03-12 | Discharge: 2017-03-12 | Disposition: A | Discharge status: Home / Self Care | Payer: Self-pay | Attending: Emergency Medicine | Admitting: Emergency Medicine

## 2017-03-12 ENCOUNTER — Encounter (HOSPITAL_COMMUNITY): Payer: Self-pay | Admitting: Family Medicine

## 2017-03-12 ENCOUNTER — Emergency Department (HOSPITAL_COMMUNITY): Payer: Self-pay

## 2017-03-12 DIAGNOSIS — K8020 Calculus of gallbladder without cholecystitis without obstruction: Secondary | ICD-10-CM

## 2017-03-12 DIAGNOSIS — R1011 Right upper quadrant pain: Secondary | ICD-10-CM

## 2017-03-12 HISTORY — DX: Gastritis, unspecified, without bleeding: K29.70

## 2017-03-12 LAB — CBC
HCT: 38.5 % (ref 36.0–46.0)
HEMOGLOBIN: 13 g/dL (ref 12.0–15.0)
MCH: 31.8 pg (ref 26.0–34.0)
MCHC: 33.8 g/dL (ref 30.0–36.0)
MCV: 94.1 fL (ref 78.0–100.0)
Platelets: 266 10*3/uL (ref 150–400)
RBC: 4.09 MIL/uL (ref 3.87–5.11)
RDW: 12.2 % (ref 11.5–15.5)
WBC: 13.4 10*3/uL — ABNORMAL HIGH (ref 4.0–10.5)

## 2017-03-12 LAB — COMPREHENSIVE METABOLIC PANEL
ALBUMIN: 4.2 g/dL (ref 3.5–5.0)
ALK PHOS: 68 U/L (ref 38–126)
ALT: 23 U/L (ref 14–54)
AST: 18 U/L (ref 15–41)
Anion gap: 10 (ref 5–15)
BUN: 6 mg/dL (ref 6–20)
CHLORIDE: 101 mmol/L (ref 101–111)
CO2: 25 mmol/L (ref 22–32)
Calcium: 8.9 mg/dL (ref 8.9–10.3)
Creatinine, Ser: 0.56 mg/dL (ref 0.44–1.00)
GFR calc non Af Amer: >60 mL/min (ref >60)
GLUCOSE: 106 mg/dL — AB (ref 65–99)
Potassium: 3.9 mmol/L (ref 3.5–5.1)
SODIUM: 136 mmol/L (ref 135–145)
Total Bilirubin: 0.7 mg/dL (ref 0.3–1.2)
Total Protein: 7.9 g/dL (ref 6.5–8.1)

## 2017-03-12 LAB — I-STAT BETA HCG BLOOD, ED (MC, WL, AP ONLY): I-stat hCG, quantitative: <5 m[IU]/mL (ref <5)

## 2017-03-12 LAB — LIPASE, BLOOD: LIPASE: 26 U/L (ref 11–51)

## 2017-03-12 MED ORDER — HYDROCODONE-ACETAMINOPHEN 5-325 MG PO TABS: 1.0000 | ORAL_TABLET | ORAL | 0 refills | Status: DC | PRN

## 2017-03-12 MED ORDER — ONDANSETRON 4 MG PO TBDP: 4.0000 mg | ORAL_TABLET | Freq: Three times a day (TID) | ORAL | 0 refills | Status: DC | PRN

## 2017-03-12 MED ORDER — MORPHINE SULFATE (PF) 4 MG/ML IV SOLN
4.0000 mg | Freq: Once | INTRAVENOUS | Status: AC
  Administered 2017-03-12: 4 mg via INTRAVENOUS
  Filled 2017-03-12: qty 1

## 2017-03-12 MED ORDER — ONDANSETRON HCL 4 MG/2ML IJ SOLN
4.0000 mg | Freq: Once | INTRAMUSCULAR | Status: AC
  Administered 2017-03-12: 4 mg via INTRAVENOUS
  Filled 2017-03-12: qty 2

## 2017-03-12 NOTE — ED Notes (Signed)
Pt stated that the pain returns after eating, radiates to rt shoulder. The omeprazole prescribed by the doctor earlier yesterday only helped a small amount.

## 2017-03-12 NOTE — ED Provider Notes (Signed)
[facility] DEPT Provider Note   CSN: [id number] Arrival date & time: 03/11/17  2227     History   Chief Complaint Chief Complaint  Patient presents with  . Abdominal Pain    HPI Katrina Carter is a 36 y.o. female.  The history is provided by the patient and medical records.  Abdominal Pain   Associated symptoms include nausea and vomiting.     36 year old female with history of gallstones and gastritis, presenting to the ED with abdominal pain.  Reports this began on Saturday and has been progressively worsening.  Patient localized to epigastrium and right upper quadrant with some radiation into her right shoulder blade.  Reports pain is sharp, stabbing in nature.  She reports associated nausea and vomiting.  She does report the pain seems to happen every time she eats independent of type of food.  She was seen at a local clinic in [place] today was prescribed omeprazole but has not had any significant relief of symptoms.  She does report history of gallstones, cannot state whether this feels similar or not.  She was never evaluated by a surgeon in the past for her gallstones.  Past Medical History:  Diagnosis Date  . Abdominal pain   . Cholelithiasis   . Gastritis     Patient Active Problem List   Diagnosis Date Noted  . Spontaneous vaginal delivery 08/10/2013  . NSVD (normal spontaneous vaginal delivery) 09/22/2011  . Depression 06/29/2011  . Supervision of normal subsequent pregnancy 06/29/2011  . Cholelithiasis 04/24/2011  . MITTELSCHMERZ 08/14/2005    Past Surgical History:  Procedure Laterality Date  . NO PAST SURGERIES      OB History    Gravida Para Term Preterm AB Living   '3 3 3 '$ 0 0 3   SAB TAB Ectopic Multiple Live Births   0 0 0 0 3       Home Medications    Prior to Admission medications   Medication Sig Start Date End Date Taking? Authorizing Provider  ibuprofen (ADVIL,MOTRIN) 600 MG tablet Take  1 tablet (600 mg total) by mouth every 6 (six) hours. 08/11/13   Gwen Pounds, CNM  Prenatal Vit-Fe Fumarate-FA (PRENATAL MULTIVITAMIN) TABS Take 1 tablet by mouth daily.    [provider]    Family History Family History  Problem Relation Age of Onset  . Anesthesia problems Neg Hx   . Hypotension Neg Hx   . Malignant hyperthermia Neg Hx   . Pseudochol deficiency Neg Hx     Social History Social History   Tobacco Use  . Smoking status: Never Smoker  . Smokeless tobacco: Never Used  Substance Use Topics  . Alcohol use: No  . Drug use: No     Allergies   Patient has no known allergies.   Review of Systems Review of Systems  Gastrointestinal: Positive for abdominal pain, nausea and vomiting.  All other systems reviewed and are negative.    Physical Exam Updated Vital Signs BP (!) 124/94 (BP Location: Left Arm)   Pulse 82   Temp 98.7 F (37.1 C) (Oral)   Resp 18   Ht '4\' 11"'$  (1.499 m)   Wt 76.7 kg (169 lb)   SpO2 98%   BMI 34.13 kg/m   Physical Exam  Constitutional: She is oriented to person, place, and time. She appears well-developed and well-nourished.  HENT:  Head: Normocephalic and atraumatic.  Mouth/Throat: Oropharynx is clear and moist.  Eyes: Conjunctivae and EOM  are normal. Pupils are equal, round, and reactive to light.  Neck: Normal range of motion.  Cardiovascular: Normal rate, regular rhythm and normal heart sounds.  Pulmonary/Chest: Effort normal and breath sounds normal.  Abdominal: Soft. Bowel sounds are normal. There is tenderness in the right upper quadrant and epigastric area. There is guarding (voluntary). There is no rigidity, no rebound and no CVA tenderness.  Musculoskeletal: Normal range of motion.  Neurological: She is alert and oriented to person, place, and time.  Skin: Skin is warm and dry.  Psychiatric: She has a normal mood and affect.  Nursing note and vitals reviewed.    ED Treatments / Results  Labs (all  labs ordered are listed, but only abnormal results are displayed) Labs Reviewed  COMPREHENSIVE METABOLIC PANEL - Abnormal; Notable for the following components:      Result Value   Glucose, Bld 106 (*)    All other components within normal limits  CBC - Abnormal; Notable for the following components:   WBC 13.4 (*)    All other components within normal limits  LIPASE, BLOOD  URINALYSIS, ROUTINE W REFLEX MICROSCOPIC  I-STAT BETA HCG BLOOD, ED ([facility], [facility], [facility] ONLY)    EKG  EKG Interpretation None       Radiology US Abdomen Limited Ruq  Result Date: 03/12/2017 CLINICAL DATA:  Right upper quadrant pain.  History of gallstones. EXAM: ULTRASOUND ABDOMEN LIMITED RIGHT UPPER QUADRANT COMPARISON:  None. FINDINGS: Gallbladder: Distended spanning 12 cm in length containing multiple intraluminal gallstones. No gallbladder wall thickening, wall thickness of 2 mm. No sonographic Murphy sign noted by sonographer. Common bile duct: Diameter: 5 mm proximally, distal portion not seen. Liver: No focal lesion identified. Slightly patchy in parenchymal echogenicity. Portal vein is patent on color Doppler imaging with normal direction of blood flow towards the liver. IMPRESSION: 1. Distended gallbladder containing multiple gallstones. No gallbladder wall thickening or sonographic Murphy sign suggest acute cholecystitis. 2. No biliary dilatation. Electronically Signed   By: Jeb Levering M.D.   On: 03/12/2017 05:52    Procedures Procedures (including critical care time)  Medications Ordered in ED Medications  morphine 4 MG/ML injection 4 mg (4 mg Intravenous Given 03/12/17 0409)  ondansetron (ZOFRAN) injection 4 mg (4 mg Intravenous Given 03/12/17 0409)     Initial Impression / Assessment and Plan / ED Course  I have reviewed the triage vital signs and the nursing notes.  Pertinent labs & imaging results that were available during my care of the patient were reviewed by me and considered in my  medical decision making (see chart for details).  36 year old female here with abdominal pain.  Epigastric and right upper quadrant in nature.  Prescribed omeprazole at clinic yesterday without much relief.  She is afebrile and nontoxic.  On exam tenderness in the epigastrium but more so in the right upper quadrant.  She does have history of gallstones.  Screening lab work obtained from triage with slight leukocytosis.  LFTs, alk phos, and bili are all within normal limits.  Will obtain right upper quadrant ultrasound for further evaluation.  5:58 AM Ultrasound with distended gallbladder and multiple gallstones but no wall thickening or Murphy sign to suggest acute cholecystitis.  Patient resting comfortably, states she feels much better after medications here.  Given rather benign lab work and overall reassuring ultrasound, I do feel she is appropriate for outpatient follow-up in the surgical clinic.  Will discharge home with medications for symptomatic control.  We have discussed low-fat diet  to help reduce colicky symptoms.  She understands to return here for any new or worsening symptoms.  Final Clinical Impressions(s) / ED Diagnoses   Final diagnoses:  RUQ pain  Gallstones    ED Discharge Orders        Ordered    HYDROcodone-acetaminophen (NORCO/VICODIN) 5-325 MG tablet  Every 4 hours PRN     03/12/17 0600    ondansetron (ZOFRAN ODT) 4 MG disintegrating tablet  Every 8 hours PRN     03/12/17 0600       Larene Pickett, PA-C 03/12/17 [id number]    Ripley Fraise, MD 03/12/17 [phone]

## 2017-03-12 NOTE — Discharge Instructions (Signed)
Take the prescribed medication as directed as needed for pain or nausea. Try to follow lower fat diet to help reduce gallbladder attacks. Follow-up with the general surgery clinic-- call in the morning for appt. Return to the ED for new or worsening symptoms.

## 2017-03-12 NOTE — ED Notes (Signed)
Ultrasound at bedside

## 2017-05-17 ENCOUNTER — Ambulatory Visit: Payer: Self-pay | Attending: Nurse Practitioner | Admitting: Nurse Practitioner

## 2017-05-17 ENCOUNTER — Encounter: Payer: Self-pay | Admitting: Nurse Practitioner

## 2017-05-17 VITALS — BP 117/77 | HR 61 | Temp 98.4°F | Ht 60.0 in | Wt 165.2 lb

## 2017-05-17 DIAGNOSIS — Z79899 Other long term (current) drug therapy: Secondary | ICD-10-CM | POA: Insufficient documentation

## 2017-05-17 DIAGNOSIS — L989 Disorder of the skin and subcutaneous tissue, unspecified: Secondary | ICD-10-CM | POA: Insufficient documentation

## 2017-05-17 DIAGNOSIS — R7989 Other specified abnormal findings of blood chemistry: Secondary | ICD-10-CM

## 2017-05-17 DIAGNOSIS — M25571 Pain in right ankle and joints of right foot: Secondary | ICD-10-CM | POA: Insufficient documentation

## 2017-05-17 MED ORDER — IBUPROFEN 800 MG PO TABS: 800.0000 mg | ORAL_TABLET | Freq: Three times a day (TID) | ORAL | 0 refills | Status: AC | PRN

## 2017-05-17 NOTE — Progress Notes (Signed)
Assessment & Plan:  Katrina Carter was seen today for leg pain.  Diagnoses and all orders for this visit:  Acute right ankle pain -     ibuprofen (ADVIL,MOTRIN) 800 MG tablet; Take 1 tablet (800 mg total) by mouth every 8 (eight) hours as needed.  Alternate heat and ice application to right ankle.  Wear compression socks instead of compression hose  Abnormal CBC -     CBC    Patient has been counseled on age-appropriate routine health concerns for screening and prevention. These are reviewed and up-to-date. Referrals have been placed accordingly. Immunizations are up-to-date or declined.    Subjective:   Chief Complaint  Patient presents with  . Leg Pain    Patient stated her right foot hurts and her ankle would swell when she works or driving. Notice the swelling 5 months ago.    HPI Katrina Carter 37 y.o. female presents to office today to establish care. She has a history of cholelithiasis which has resolved at this time. She denies HTN, HPL or DM. She has complaints of right foot pain today.   Right Ankle Pain Complaints of right lateral ankle pain and swelling. Onset 2 months ago. She denies any fall or trauma to her right foot. Aggravating factors: standing for prolonged periods of time; driving.  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 Relieving factors: ibuprofen or compression stockings. She wears compression stockings but not consistently as she reports working all day in her stockings is very uncomfortable and makes her hot. Pain and swelling occurs every day and lasts for a few hours.   Skin Lesion Patient complains of skin lesion. The lesion is located on the forehead. The size has gradually increased over the past few years. She denies any itching, bleeding or contact with any irritants.  Pain is rated 0/10. Interventions to date: none. Patient denies tobacco use. Patient does not have a history of diabetes.       Review of Systems  Constitutional: Negative for fever, malaise/fatigue and weight loss.  Respiratory: Negative.  Negative for cough, shortness of breath and wheezing.   Cardiovascular: Negative.  Negative for chest pain, palpitations and leg swelling.  Gastrointestinal: Negative.  Negative for abdominal pain, constipation, diarrhea, heartburn, nausea and vomiting.  Musculoskeletal: Positive for joint pain. Negative for myalgias.       SEE HPI  Skin: Positive for rash.  Neurological: Negative.  Negative for dizziness, focal weakness, seizures and headaches.  Psychiatric/Behavioral: Negative.  Negative for suicidal ideas.    Past Medical History:  Diagnosis Date  . Abdominal pain   . Cholelithiasis   . Gastritis     Past Surgical History:  Procedure Laterality Date  . NO PAST SURGERIES      Family History  Problem Relation Age of Onset  . Anesthesia problems Neg Hx   . Hypotension Neg Hx   . Malignant hyperthermia Neg Hx   . Pseudochol deficiency Neg Hx     Social History Reviewed with no changes to be made today.   Outpatient Medications Prior to Visit  Medication Sig Dispense Refill  . IBUPROFEN PO Take by mouth.    Marland Kitchen. HYDROcodone-acetaminophen (NORCO/VICODIN) 5-325 MG tablet Take 1 tablet by mouth every 4 (four) hours as needed. 10 tablet 0  . omeprazole (PRILOSEC) 40 MG capsule Take 40 mg by mouth daily.    . ondansetron (ZOFRAN ODT) 4 MG disintegrating tablet Take 1 tablet (4 mg  total) by mouth every 8 (eight) hours as needed for nausea. 10 tablet 0   No facility-administered medications prior to visit.     No Known Allergies     Objective:    BP 117/77 (BP Location: Left Arm, Patient Position: Sitting, Cuff Size: Normal)   Pulse 61   Temp 98.4 F (36.9 C) (Oral)   Ht 5' (1.524 m)   Wt 165 lb 3.2 oz (74.9 kg)   LMP 04/29/2017   SpO2 100%   BMI 32.26 kg/m  Wt Readings from Last 3 Encounters:  05/17/17 165 lb 3.2 oz (74.9 kg)  03/12/17 169 lb  (76.7 kg)  08/10/13 174 lb (78.9 kg)    Physical Exam  Constitutional: She is oriented to person, place, and time. She appears well-developed and well-nourished. She is cooperative.  HENT:  Head: Normocephalic and atraumatic.    Cardiovascular: Normal rate, regular rhythm, normal heart sounds and intact distal pulses. Exam reveals no gallop and no friction rub.  No murmur heard. Pulmonary/Chest: Effort normal and breath sounds normal. No tachypnea. No respiratory distress. She has no decreased breath sounds. She has no wheezes. She has no rhonchi. She has no rales. She exhibits no tenderness.  Musculoskeletal: She exhibits no edema.       Right ankle: She exhibits decreased range of motion and swelling. Tenderness. Lateral malleolus tenderness found.  She is full weight bearing. Pain is elicited with passive dorsiflexion and internal rotation of right ankle and foot.   Neurological: She is alert and oriented to person, place, and time. Coordination normal.  Skin: Skin is warm and dry.  Psychiatric: She has a normal mood and affect. Her behavior is normal. Judgment and thought content normal.  Nursing note and vitals reviewed.     Patient has been counseled extensively about nutrition and exercise as well as the importance of adherence with medications and regular follow-up. The patient was given clear instructions to go to ER or return to medical center if symptoms don't improve, worsen or new problems develop. The patient verbalized understanding.   Follow-up: Return in about 3 weeks (around 06/07/2017) for right foot pain/may need xray.   Claiborne Rigg, FNP-BC [facility] and [facility] [place], [place] [phone]   05/17/2017, 10:09 PM

## 2017-05-17 NOTE — Patient Instructions (Addendum)
Dolor en el tobillo Ankle Pain Existen muchos factores causantes del dolor de tobillo; entre ellos, una lesin en la zona y el uso excesivo del tobillo.La articulacin del tobillo mantiene el peso del cuerpo y le permite moverse. El dolor puede ocurrir en la parte interna, externa o posterior de uno o ambos tobillos. Puede sentir un dolor agudo que causa sensacin de ardor, o puede ser un dolor sordo y [place]. Puede haber dolor a la palpacin, rigidez, enrojecimiento o sensacin de calor alrededor del tobillo. Siga estas indicaciones en su casa: Actividad  Ponga el tobillo en reposo como se lo haya indicado el mdico. Evite las actividades que causan el dolor en el tobillo.  Haga ejercicios como se lo haya indicado el mdico.  Pregntele al mdico si puede conducir. Uso de un aparato ortopdico, vendaje o Optometristmuletas  Si le pusieron un dispositivo ortopdico: ? Technical sales engineerselo como se lo haya indicado el mdico. ? Quteselo para baarse. ? Trate de no mover mucho el tobillo, pero mueva los dedos del pie de vez en cuando. Esto ayuda a evitar la hinchazn.  Si le pusieron una venda elstica: ? Qutesela para baarse. ? Trate de no mover mucho el tobillo, pero mueva los dedos del pie de vez en cuando. Esto ayuda a evitar la hinchazn. ? Si siente que la venda est muy ajustada, puede aflojarla un poco para estar ms cmodo. ? Afloje la venda si tiene adormecimiento u hormigueo en el pie o si lo siente fro y est de Edison Internationalcolor azul.  Si [place] muletas, selas como se lo haya indicado el mdico. Siga usndolas hasta que pueda caminar sin sentir dolor en el tobillo. Control del dolor, de la rigidez y de la hinchazn  Cuando est sentado o Shopiereacostado, levante (eleve) el tobillo por encima del nivel del corazn.  Si se lo indican, aplique hielo sobre la zona: ? Ponga el hielo en una bolsa plstica. ? Coloque una FirstEnergy Corptoalla entre la piel y la bolsa de hielo. ? Coloque el hielo durante 20minutos, 2 a 3veces por  da. Instrucciones generales  Concurra a todas las visitas de control como se lo haya indicado el mdico. Esto es importante.  Registre la siguiente informacin que puede ser til para usted y para el mdico: ? Con qu frecuencia le duele el tobillo. ? La ubicacin del dolor. ? Cmo se siente el dolor.  Tome los medicamentos de venta libre y los recetados solamente como se lo haya indicado el mdico. Comunquese con un mdico si:  El Product/process development scientistdolor empeora.  El dolor no se alivia con los United Parcelmedicamentos.  Tiene fiebre o siente escalofros.  Tiene ms dificultad para caminar.  Aparecen nuevos sntomas. Solicite ayuda de inmediato si:  Tiene adormecimiento o siente hormigueos en el pie, la pierna, los dedos del pie o el tobillo.  Tiene el pie, la pierna o el tobillo hinchados.  El pie, la pierna, los dedos o el tobillo se tornan plidos o Yaleazules. Esta informacin no tiene Theme park managercomo fin reemplazar el consejo del mdico. Asegrese de hacerle al mdico cualquier pregunta que tenga. Document Released: 12/05/2011 Document Revised: 06/11/2016 Document Reviewed: 10/12/2014 Elsevier Interactive Patient Education  [employer]2018 Elsevier Inc.

## 2017-05-18 LAB — CBC
Hematocrit: 39.2 % (ref 34.0–46.6)
Hemoglobin: 13 g/dL (ref 11.1–15.9)
MCH: 31.9 pg (ref 26.6–33.0)
MCHC: 33.2 g/dL (ref 31.5–35.7)
MCV: 96 fL (ref 79–97)
PLATELETS: 298 10*3/uL (ref 150–379)
RBC: 4.08 x10E6/uL (ref 3.77–5.28)
RDW: 12.8 % (ref 12.3–15.4)
WBC: 9.8 10*3/uL (ref 3.4–10.8)

## 2017-05-28 ENCOUNTER — Telehealth: Payer: Self-pay

## 2017-05-28 NOTE — Telephone Encounter (Signed)
CMA attempt to call patient to inform on lab results.  No answer and left a VM for patient to call back for results.  If patient call back, please inform:  WBC is now normal.

## 2017-05-28 NOTE — Telephone Encounter (Signed)
-----   Message from Claiborne RiggZelda W Fleming, NP sent at 05/26/2017  7:47 PM EST ----- WBC is now normal.

## 2017-05-29 ENCOUNTER — Telehealth: Payer: Self-pay | Admitting: Nurse Practitioner

## 2017-05-29 ENCOUNTER — Ambulatory Visit: Payer: Self-pay | Attending: Nurse Practitioner

## 2017-05-29 NOTE — Telephone Encounter (Signed)
Pt came in and received results thaat CMA left on file -WBC is now normal Pt understood and had no further questions

## 2017-05-29 NOTE — Telephone Encounter (Signed)
Noted  

## 2017-06-12 ENCOUNTER — Ambulatory Visit: Payer: Self-pay | Attending: Nurse Practitioner | Admitting: Nurse Practitioner

## 2017-06-12 ENCOUNTER — Encounter: Payer: Self-pay | Admitting: Nurse Practitioner

## 2017-06-12 VITALS — BP 114/75 | HR 85 | Temp 98.9°F | Ht 60.0 in | Wt 168.4 lb

## 2017-06-12 DIAGNOSIS — L989 Disorder of the skin and subcutaneous tissue, unspecified: Secondary | ICD-10-CM | POA: Insufficient documentation

## 2017-06-12 DIAGNOSIS — R1011 Right upper quadrant pain: Secondary | ICD-10-CM | POA: Insufficient documentation

## 2017-06-12 DIAGNOSIS — M25571 Pain in right ankle and joints of right foot: Secondary | ICD-10-CM | POA: Insufficient documentation

## 2017-06-12 NOTE — Patient Instructions (Addendum)
Colelitiasis  (Cholelithiasis)  La colelitiasis (tambin llamada clculos en la vescula) es una enfermedad de la vescula. La vescula es un rgano pequeo que ayuda a digerir las grasas. Los sntomas de clculos en la vescula son:   Ganas de vomitar (nuseas).   Devolver la comida (vomitar).   Dolor en el vientre.   Piel amarilla (ictericia)   Dolor sbito. Podr sentir el dolor durante algunos minutos o unas horas.   Fiebre.   Dolor a la palpacin.  CUIDADOS EN EL HOGAR   Tome slo los medicamentos segn le haya indicado el mdico.   Siga una dieta baja en grasas hasta que vuelva a ver al mdico nuevamente. Consumir grasas puede causar dolor.   Concurra a las consultas de control con el mdico, segn las indicaciones. Los ataques generalmente ocurren repetidas veces. Generalmente se requiere de una ciruga como tratamiento permanente.    SOLICITE AYUDA DE INMEDIATO SI:   El dolor empeora.   El dolor no se alivia con los medicamentos.   Tiene fiebre o sntomas que persisten durante ms de 2-3 das.   Tiene fiebre y los sntomas empeoran repentinamente.   Siente nuseas y vomita.    ASEGRESE DE QUE:   Comprende estas instrucciones.   Controlar su afeccin.   Recibir ayuda de inmediato si no mejora o si empeora.    Esta informacin no tiene como fin reemplazar el consejo del mdico. Asegrese de hacerle al mdico cualquier pregunta que tenga.  Document Released: 06/08/2008 Document Revised: 11/12/2012 Document Reviewed: 09/03/2012  Elsevier Interactive Patient Education  2017 Elsevier Inc.

## 2017-06-12 NOTE — Progress Notes (Signed)
Assessment & Plan:  Katrina Carter was seen today for follow-up and abdominal pain.  Diagnoses and all orders for this visit:  Acute right ankle pain -     DG Ankle Complete Right; Future May alternate with heat and ice application for pain relief. May also alternate with acetaminophen and Ibuprofen as prescribed for  pain. You must stay active and avoid a sedentary lifestyle.  Skin lesion -     Ambulatory referral to Dermatology  Right upper quadrant pain -     US Abdomen Limited RUQ; Future -     CMP and Liver   Patient has been counseled on age-appropriate routine health concerns for screening and prevention. These are reviewed and up-to-date. Referrals have been placed accordingly. Immunizations are up-to-date or declined.    Subjective:   Chief Complaint  Patient presents with  . Follow-up    Patient is here follow-up on her right foot pain. 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   . Abdominal Pain    Pt stated she had gall bladder stone and having stabbing pain on her right side. When she eats greasy food it hurts more.    HPI Katrina Carter 37 y.o. female presents to office today for follow up to right ankle pain.  Right Ankle Pain Chronic.  She has complaints of right lateral ankle pain and swelling.  Relieving factors: Taking Ibuprofen and wearing compression socks however minimal relief.  Denies any injury or fall.  Aggravating factors: Long periods of standing and driving.  Pain and swelling are occurring daily.  She is overweight and likely not diet compliant in regards to salt intake.   RUQ PAIN She has a history of epigastric pain and has been prescribed Dexilant as well as omeprazole in the past which she reports provided little to no relief of her symptoms. She does have a history of multiple gallstones without cholecystitis seen on imaging of abdominal ultrasound March 12, 2017. Problems were first noted a few months ago. Current  symptoms include lower epigastric pain, RUQ Pain, nausea.  Pancreatic symptoms include none. Patient denies vomiting, fever, chills, diarrhea.  Symptoms are gradually worsening. She has taken tylenol with little relief of pain. Pain is worse with eating greasy foods.   Review of Systems  Constitutional: Negative for fever, malaise/fatigue and weight loss.  HENT: Negative.   Eyes: Negative.  Negative for blurred vision, double vision and photophobia.  Respiratory: Negative.  Negative for cough and shortness of breath.   Cardiovascular: Negative.  Negative for chest pain, palpitations and leg swelling.  Gastrointestinal: Positive for abdominal pain and nausea. Negative for heartburn and vomiting.  Genitourinary: Negative for flank pain.  Musculoskeletal: Negative.  Negative for myalgias.  Skin: Positive for rash.  Neurological: Negative.  Negative for dizziness, focal weakness, seizures and headaches.  Psychiatric/Behavioral: Negative.  Negative for suicidal ideas.    Past Medical History:  Diagnosis Date  . Abdominal pain   . Cholelithiasis   . Gastritis     Past Surgical History:  Procedure Laterality Date  . NO PAST SURGERIES      Family History  Problem Relation Age of Onset  . Anesthesia problems Neg Hx   . Hypotension Neg Hx   . Malignant hyperthermia Neg Hx   . Pseudochol deficiency Neg Hx     Social History Reviewed with no changes to be made today.   Outpatient Medications Prior to Visit  Medication Sig Dispense Refill  .  ibuprofen (ADVIL,MOTRIN) 800 MG tablet Take 1 tablet (800 mg total) by mouth every 8 (eight) hours as needed. 30 tablet 0   No facility-administered medications prior to visit.     No Known Allergies     Objective:    BP 114/75 (BP Location: Left Arm, Patient Position: Sitting, Cuff Size: Normal)   Pulse 85   Temp 98.9 F (37.2 C) (Oral)   Ht 5' (1.524 m)   Wt 168 lb 6.4 oz (76.4 kg)   SpO2 98%   BMI 32.89 kg/m  Wt Readings from Last  3 Encounters:  06/12/17 168 lb 6.4 oz (76.4 kg)  05/17/17 165 lb 3.2 oz (74.9 kg)  03/12/17 169 lb (76.7 kg)    Physical Exam  Constitutional: She is oriented to person, place, and time. She appears well-developed and well-nourished. She is cooperative.  HENT:  Head: Normocephalic and atraumatic.    Cardiovascular: Normal rate, regular rhythm and normal heart sounds. Exam reveals no gallop and no friction rub.  No murmur heard. Pulmonary/Chest: Effort normal and breath sounds normal. No tachypnea. No respiratory distress. She has no decreased breath sounds. She has no wheezes. She has no rhonchi. She has no rales. She exhibits no tenderness.  Abdominal: Soft. Bowel sounds are normal. She exhibits no distension and no mass. There is tenderness in the right upper quadrant, epigastric area and periumbilical area. There is no rebound, no guarding and no CVA tenderness.  Musculoskeletal: She exhibits no edema.       Right ankle: She exhibits decreased range of motion and swelling. She exhibits no deformity and normal pulse. Tenderness. Lateral malleolus tenderness found.  Neurological: She is alert and oriented to person, place, and time. Coordination normal.  Skin: Skin is warm and dry.  Psychiatric: She has a normal mood and affect. Her behavior is normal. Judgment and thought content normal.  Nursing note and vitals reviewed.      Patient has been counseled extensively about nutrition and exercise as well as the importance of adherence with medications and regular follow-up. The patient was given clear instructions to go to ER or return to medical center if symptoms don't improve, worsen or new problems develop. The patient verbalized understanding.   Follow-up: Return for PAP SMEAR.   Claiborne RiggZelda W Cleola Perryman, FNP-BC [facility] [place], [place] [phone]   06/13/2017, 12:44 AM

## 2017-06-13 ENCOUNTER — Encounter: Payer: Self-pay | Admitting: Nurse Practitioner

## 2017-06-13 LAB — CMP AND LIVER
ALT: 22 IU/L (ref 0–32)
AST: 15 IU/L (ref 0–40)
Albumin: 4.3 g/dL (ref 3.5–5.5)
Alkaline Phosphatase: 89 IU/L (ref 39–117)
BUN: 11 mg/dL (ref 6–20)
Bilirubin Total: <0.2 mg/dL (ref 0.0–1.2)
Bilirubin, Direct: 0.05 mg/dL (ref 0.00–0.40)
CALCIUM: 9.2 mg/dL (ref 8.7–10.2)
CO2: 25 mmol/L (ref 20–29)
CREATININE: 0.71 mg/dL (ref 0.57–1.00)
Chloride: 106 mmol/L (ref 96–106)
GFR, EST AFRICAN AMERICAN: 126 mL/min/{1.73_m2} (ref >59)
GFR, EST NON AFRICAN AMERICAN: 109 mL/min/{1.73_m2} (ref >59)
GLUCOSE: 95 mg/dL (ref 65–99)
Potassium: 3.7 mmol/L (ref 3.5–5.2)
Sodium: 144 mmol/L (ref 134–144)
TOTAL PROTEIN: 7.5 g/dL (ref 6.0–8.5)

## 2017-06-18 ENCOUNTER — Ambulatory Visit (HOSPITAL_COMMUNITY)
Admission: RE | Admit: 2017-06-18 | Discharge: 2017-06-18 | Disposition: A | Discharge status: Home / Self Care | Payer: Self-pay | Source: Ambulatory Visit | Attending: Nurse Practitioner | Admitting: Nurse Practitioner

## 2017-06-18 DIAGNOSIS — M25571 Pain in right ankle and joints of right foot: Secondary | ICD-10-CM | POA: Insufficient documentation

## 2017-06-18 DIAGNOSIS — K8020 Calculus of gallbladder without cholecystitis without obstruction: Secondary | ICD-10-CM | POA: Insufficient documentation

## 2017-06-18 DIAGNOSIS — R1011 Right upper quadrant pain: Secondary | ICD-10-CM | POA: Insufficient documentation

## 2017-06-18 DIAGNOSIS — M7989 Other specified soft tissue disorders: Secondary | ICD-10-CM | POA: Insufficient documentation

## 2017-06-23 ENCOUNTER — Other Ambulatory Visit: Payer: Self-pay | Admitting: Nurse Practitioner

## 2017-06-23 DIAGNOSIS — K8020 Calculus of gallbladder without cholecystitis without obstruction: Secondary | ICD-10-CM

## 2017-06-24 ENCOUNTER — Encounter: Payer: Self-pay | Admitting: Gastroenterology

## 2017-06-25 ENCOUNTER — Telehealth: Payer: Self-pay

## 2017-06-25 NOTE — Telephone Encounter (Signed)
Will route to PCP 

## 2017-06-25 NOTE — Telephone Encounter (Signed)
-----   Message from Guy Francoravia Benjamin, RN sent at 06/25/2017  4:13 PM EDT -----   ----- Message ----- From: Claiborne RiggFleming, Zelda W, NP Sent: 06/23/2017   9:25 PM To: Vidal SchwalbeBien Yy, [place]  Your ultrasound shows gallstones but nothing acute. I will refer you to gastroenterology at this time for further evaluation and recommendations

## 2017-06-25 NOTE — Telephone Encounter (Signed)
CMA call patient regarding lab results x ray results & US results   Patient Verify DOB   Patient was aware and understood

## 2017-06-25 NOTE — Telephone Encounter (Signed)
-----   Message from Guy Francoravia Benjamin, RN sent at 06/25/2017  4:14 PM EDT -----   ----- Message ----- From: Claiborne RiggFleming, Zelda W, NP Sent: 06/23/2017   9:04 PM To: Vidal SchwalbeBien Yy, CMA  The xray of your ankle is normal. There is no fracture. Just swelling of the soft tissue. You should wrap it as much as possible when you are standing and walking. Keep it elevated at home and apply ice. Ibuprofen can help relieve some of the swelling as well.

## 2017-06-26 ENCOUNTER — Other Ambulatory Visit: Payer: Self-pay | Admitting: Nurse Practitioner

## 2017-06-26 ENCOUNTER — Telehealth: Payer: Self-pay | Admitting: Nurse Practitioner

## 2017-06-26 DIAGNOSIS — K8020 Calculus of gallbladder without cholecystitis without obstruction: Secondary | ICD-10-CM

## 2017-06-26 NOTE — Telephone Encounter (Signed)
CMA call patient regarding if she wants surgery consult so her pcp can put a referral   Patient did not answer but let a VM stating to call back at the office

## 2017-06-26 NOTE — Progress Notes (Signed)
   Assessment & Plan:  There are no diagnoses linked to this encounter.  Patient has been counseled on age-appropriate routine health concerns for screening and prevention. These are reviewed and up-to-date. Referrals have been placed accordingly. Immunizations are up-to-date or declined.    Subjective:  No chief complaint on file.  HPI Katrina Carter 37 y.o. female presents to office today   ROS  Past Medical History:  Diagnosis Date  . Abdominal pain   . Cholelithiasis   . Gastritis     Past Surgical History:  Procedure Laterality Date  . NO PAST SURGERIES      Family History  Problem Relation Age of Onset  . Anesthesia problems Neg Hx   . Hypotension Neg Hx   . Malignant hyperthermia Neg Hx   . Pseudochol deficiency Neg Hx     Social History Reviewed with no changes to be made today.   Outpatient Medications Prior to Visit  Medication Sig Dispense Refill  . ibuprofen (ADVIL,MOTRIN) 800 MG tablet Take 1 tablet (800 mg total) by mouth every 8 (eight) hours as needed. 30 tablet 0   No facility-administered medications prior to visit.     No Known Allergies     Objective:    There were no vitals taken for this visit. Wt Readings from Last 3 Encounters:  06/12/17 168 lb 6.4 oz (76.4 kg)  05/17/17 165 lb 3.2 oz (74.9 kg)  03/12/17 169 lb (76.7 kg)    Physical Exam       Patient has been counseled extensively about nutrition and exercise as well as the importance of adherence with medications and regular follow-up. The patient was given clear instructions to go to ER or return to medical center if symptoms don't improve, worsen or new problems develop. The patient verbalized understanding.   Follow-up: No follow-ups on file.   Claiborne RiggZelda W Fleming, FNP-BC [facility] [place], [place] [phone]   06/26/2017, 10:40 AM

## 2017-06-26 NOTE — Telephone Encounter (Signed)
Pt called returning your call, to let you know that she want the surgery hopping that way she be not in pain,. Please follow up

## 2017-06-26 NOTE — Telephone Encounter (Signed)
General surgery referral placed

## 2017-06-26 NOTE — Telephone Encounter (Signed)
She needs to let us know if she wants to be treated for the pain by GI or have them removed. We can not make that decision for her and then I will place referral if she requests a surgical consult

## 2017-06-27 NOTE — Telephone Encounter (Signed)
CMA made a second attempt to ask if she wants a surgical consult   Patient did not answer but left a VM stating to call back

## 2017-06-28 NOTE — Telephone Encounter (Deleted)
-----   Message from Claiborne RiggZelda W Fleming, NP sent at 06/23/2017  9:32 PM EDT ----- Labs are essentially normal. Make sure you are drinking at least 48 oz of water per day. Work on eating a low fat, heart healthy diet and participate in regular aerobic exercise program to control as well. Exercise at least 150 minutes per week.

## 2017-06-28 NOTE — Telephone Encounter (Signed)
CMA attempt to call patient to inform on lab results and PCP advising. Spanish interpreter [place] SinkRita [phone]  No answer and left a VM for patient.    If patient call back, please inform:  Labs are essentially normal. Make sure you are drinking at least 48 oz of water per day. Work on eating a low fat, heart healthy diet and participate in regular aerobic exercise program to control as well. Exercise at least 150 minutes per week.

## 2017-07-01 ENCOUNTER — Other Ambulatory Visit: Payer: Self-pay

## 2017-07-04 ENCOUNTER — Encounter: Payer: Self-pay | Admitting: Nurse Practitioner

## 2017-07-10 ENCOUNTER — Ambulatory Visit (INDEPENDENT_AMBULATORY_CARE_PROVIDER_SITE_OTHER): Payer: Self-pay | Admitting: Surgery

## 2017-07-10 ENCOUNTER — Encounter: Payer: Self-pay | Admitting: Surgery

## 2017-07-10 ENCOUNTER — Other Ambulatory Visit
Admission: RE | Admit: 2017-07-10 | Discharge: 2017-07-10 | Disposition: A | Discharge status: Home / Self Care | Payer: Self-pay | Source: Ambulatory Visit | Attending: Surgery | Admitting: Surgery

## 2017-07-10 ENCOUNTER — Ambulatory Visit
Admission: RE | Admit: 2017-07-10 | Discharge: 2017-07-10 | Disposition: A | Discharge status: Home / Self Care | Payer: Self-pay | Source: Ambulatory Visit | Attending: Surgery | Admitting: Surgery

## 2017-07-10 VITALS — BP 132/83 | HR 73 | Temp 97.8°F | Ht 64.0 in | Wt 167.2 lb

## 2017-07-10 DIAGNOSIS — K760 Fatty (change of) liver, not elsewhere classified: Secondary | ICD-10-CM | POA: Insufficient documentation

## 2017-07-10 DIAGNOSIS — R1011 Right upper quadrant pain: Secondary | ICD-10-CM

## 2017-07-10 DIAGNOSIS — K8020 Calculus of gallbladder without cholecystitis without obstruction: Secondary | ICD-10-CM | POA: Insufficient documentation

## 2017-07-10 LAB — COMPREHENSIVE METABOLIC PANEL
ALBUMIN: 4.3 g/dL (ref 3.5–5.0)
ALK PHOS: 75 U/L (ref 38–126)
ALT: 19 U/L (ref 14–54)
AST: 18 U/L (ref 15–41)
Anion gap: 8 (ref 5–15)
BUN: 9 mg/dL (ref 6–20)
CALCIUM: 9 mg/dL (ref 8.9–10.3)
CO2: 27 mmol/L (ref 22–32)
CREATININE: 0.62 mg/dL (ref 0.44–1.00)
Chloride: 105 mmol/L (ref 101–111)
GFR calc Af Amer: >60 mL/min (ref >60)
GFR calc non Af Amer: >60 mL/min (ref >60)
GLUCOSE: 102 mg/dL — AB (ref 65–99)
Potassium: 4 mmol/L (ref 3.5–5.1)
SODIUM: 140 mmol/L (ref 135–145)
Total Bilirubin: 0.5 mg/dL (ref 0.3–1.2)
Total Protein: 7.8 g/dL (ref 6.5–8.1)

## 2017-07-10 LAB — CBC WITH DIFFERENTIAL/PLATELET
BASOS PCT: 1 %
Basophils Absolute: 0.1 10*3/uL (ref 0–0.1)
EOS ABS: 0.8 10*3/uL — AB (ref 0–0.7)
Eosinophils Relative: 10 %
HEMATOCRIT: 38.2 % (ref 35.0–47.0)
Hemoglobin: 13.2 g/dL (ref 12.0–16.0)
Lymphocytes Relative: 32 %
Lymphs Abs: 2.7 10*3/uL (ref 1.0–3.6)
MCH: 32.1 pg (ref 26.0–34.0)
MCHC: 34.6 g/dL (ref 32.0–36.0)
MCV: 92.9 fL (ref 80.0–100.0)
MONOS PCT: 8 %
Monocytes Absolute: 0.7 10*3/uL (ref 0.2–0.9)
Neutro Abs: 4.3 10*3/uL (ref 1.4–6.5)
Neutrophils Relative %: 49 %
Platelets: 248 10*3/uL (ref 150–440)
RBC: 4.11 MIL/uL (ref 3.80–5.20)
RDW: 12.5 % (ref 11.5–14.5)
WBC: 8.6 10*3/uL (ref 3.6–11.0)

## 2017-07-10 NOTE — Patient Instructions (Signed)
Por favor vaya al [facility] (el proximo edificio) y vaya a [facility] y dejele saber que usted necesita un ultrasonido y examenes de [place].  Luego yo Patent examinerla llamare con los [place]ados.

## 2017-07-11 NOTE — H&P (View-Only) (Signed)
Patient ID: Katrina Carter, female   DOB: 1980-05-11, 37 y.o.   MRN: [id number]  HPI Katrina Carter is a 37 y.o. female Seen in consultation at the request of Mrs Meredeth IdeFleming NP, she reports right upper quadrant pain for the last few months patient reports the pain is intermittent sharp in nature.  She did have an episode that made her go to the emergency room about 4 months ago. Able to perform more than 4 metastases of activity without any shortness of breath or chest pain.  She reports that the pain radiates to her back and worsening with heavy meals.  No fevers no chills no evidence of biliary obstruction.  No evidence of cholangitis. Ultrasound personally reviewed showing evidence of cholelithiasis without cholecystitis.  I have actually ordered a second ultrasound because the first ultrasound show questionable dilation of the common bile duct.  The most recent ultrasound showed a normal common bile duct and no evidence of choledocholithiasis.  Her LFTs are completely normal as well as her CBC.  HPI  Past Medical History:  Diagnosis Date  . Abdominal pain   . Cholelithiasis   . Gastritis     Past Surgical History:  Procedure Laterality Date  . NO PAST SURGERIES      Family History  Problem Relation Age of Onset  . Hypertension Mother   . Diabetes Mother   . Thyroid disease Mother   . Hypertension Father   . Anesthesia problems Neg Hx   . Hypotension Neg Hx   . Malignant hyperthermia Neg Hx   . Pseudochol deficiency Neg Hx     Social History Social History   Tobacco Use  . Smoking status: Never Smoker  . Smokeless tobacco: Never Used  Substance Use Topics  . Alcohol use: No  . Drug use: No    No Known Allergies  Current Outpatient Medications  Medication Sig Dispense Refill  . ibuprofen (ADVIL,MOTRIN) 800 MG tablet Take 1 tablet (800 mg total) by mouth every 8 (eight) hours as needed. 30 tablet 0   No current facility-administered medications for  this visit.      Review of Systems Full ROS  was asked and was negative except for the information on the HPI  Physical Exam Blood pressure 132/83, pulse 73, temperature 97.8 F (36.6 C), temperature source Oral, height 5\' 4"  (1.626 m), weight 75.8 kg (167 lb 3.2 oz), unknown if currently breastfeeding. CONSTITUTIONAL: NAD EYES: Pupils are equal, round, and reactive to light, Sclera are non-icteric. EARS, NOSE, MOUTH AND THROAT: The oropharynx is clear. The oral mucosa is pink and moist. Hearing is intact to voice. LYMPH NODES:  Lymph nodes in the neck are normal. RESPIRATORY:  Lungs are clear. There is normal respiratory effort, with equal breath sounds bilaterally, and without pathologic use of accessory muscles. CARDIOVASCULAR: Heart is regular without murmurs, gallops, or rubs. GI: The abdomen is  soft, Mild TTP RUQ, no peritonitis  And no Murphy sign. GU: Rectal deferred.   MUSCULOSKELETAL: Normal muscle strength and tone. No cyanosis or edema.   SKIN: Turgor is good and there are no pathologic skin lesions or ulcers. NEUROLOGIC: Motor and sensation is grossly normal. Cranial nerves are grossly intact. PSYCH:  Oriented to person, place and time. Affect is normal.  Data Reviewed  I have personally reviewed the patient's imaging, laboratory findings and medical records.    Assessment/Plan 37 year old female with classic signs and symptoms consistent with biliary colic.  Discussed with the patient in detail about  the nature of the disease.  I do recommend cholecystectomy.The risks, benefits, complications, treatment options, and expected outcomes were discussed with the patient. The possibilities of bleeding, recurrent infection, finding a normal gallbladder, perforation of viscus organs, damage to surrounding structures, bile leak, abscess formation, needing a drain placed, the need for additional procedures, reaction to medication, pulmonary aspiration,  failure to diagnose a  condition, the possible need to convert to an open procedure, and creating a complication requiring transfusion or operation were discussed with the patient. The patient and/or family concurred with the proposed plan, giving informed consent.  Need for emergent surgical intervention.  We will schedule her at her convenience.  Sterling Big, MD FACS General Surgeon 07/11/2017, 8:52 AM

## 2017-07-11 NOTE — Progress Notes (Signed)
Patient ID: Katrina Carter, female   DOB: 08/19/1980, 37 y.o.   MRN: [id number]  HPI Katrina Carter is a 37 y.o. female Seen in consultation at the request of Mrs Fleming NP, she reports right upper quadrant pain for the last few months patient reports the pain is intermittent sharp in nature.  She did have an episode that made her go to the emergency room about 4 months ago. Able to perform more than 4 metastases of activity without any shortness of breath or chest pain.  She reports that the pain radiates to her back and worsening with heavy meals.  No fevers no chills no evidence of biliary obstruction.  No evidence of cholangitis. Ultrasound personally reviewed showing evidence of cholelithiasis without cholecystitis.  I have actually ordered a second ultrasound because the first ultrasound show questionable dilation of the common bile duct.  The most recent ultrasound showed a normal common bile duct and no evidence of choledocholithiasis.  Her LFTs are completely normal as well as her CBC.  HPI  Past Medical History:  Diagnosis Date  . Abdominal pain   . Cholelithiasis   . Gastritis     Past Surgical History:  Procedure Laterality Date  . NO PAST SURGERIES      Family History  Problem Relation Age of Onset  . Hypertension Mother   . Diabetes Mother   . Thyroid disease Mother   . Hypertension Father   . Anesthesia problems Neg Hx   . Hypotension Neg Hx   . Malignant hyperthermia Neg Hx   . Pseudochol deficiency Neg Hx     Social History Social History   Tobacco Use  . Smoking status: Never Smoker  . Smokeless tobacco: Never Used  Substance Use Topics  . Alcohol use: No  . Drug use: No    No Known Allergies  Current Outpatient Medications  Medication Sig Dispense Refill  . ibuprofen (ADVIL,MOTRIN) 800 MG tablet Take 1 tablet (800 mg total) by mouth every 8 (eight) hours as needed. 30 tablet 0   No current facility-administered medications for  this visit.      Review of Systems Full ROS  was asked and was negative except for the information on the HPI  Physical Exam Blood pressure 132/83, pulse 73, temperature 97.8 F (36.6 C), temperature source Oral, height 5' 4" (1.626 m), weight 75.8 kg (167 lb 3.2 oz), unknown if currently breastfeeding. CONSTITUTIONAL: NAD EYES: Pupils are equal, round, and reactive to light, Sclera are non-icteric. EARS, NOSE, MOUTH AND THROAT: The oropharynx is clear. The oral mucosa is pink and moist. Hearing is intact to voice. LYMPH NODES:  Lymph nodes in the neck are normal. RESPIRATORY:  Lungs are clear. There is normal respiratory effort, with equal breath sounds bilaterally, and without pathologic use of accessory muscles. CARDIOVASCULAR: Heart is regular without murmurs, gallops, or rubs. GI: The abdomen is  soft, Mild TTP RUQ, no peritonitis  And no Murphy sign. GU: Rectal deferred.   MUSCULOSKELETAL: Normal muscle strength and tone. No cyanosis or edema.   SKIN: Turgor is good and there are no pathologic skin lesions or ulcers. NEUROLOGIC: Motor and sensation is grossly normal. Cranial nerves are grossly intact. PSYCH:  Oriented to person, place and time. Affect is normal.  Data Reviewed  I have personally reviewed the patient's imaging, laboratory findings and medical records.    Assessment/Plan 37-year-old female with classic signs and symptoms consistent with biliary colic.  Discussed with the patient in detail about   the nature of the disease.  I do recommend cholecystectomy.The risks, benefits, complications, treatment options, and expected outcomes were discussed with the patient. The possibilities of bleeding, recurrent infection, finding a normal gallbladder, perforation of viscus organs, damage to surrounding structures, bile leak, abscess formation, needing a drain placed, the need for additional procedures, reaction to medication, pulmonary aspiration,  failure to diagnose a  condition, the possible need to convert to an open procedure, and creating a complication requiring transfusion or operation were discussed with the patient. The patient and/or family concurred with the proposed plan, giving informed consent.  Need for emergent surgical intervention.  We will schedule her at her convenience.  Bexleigh Theriault, MD FACS General Surgeon 07/11/2017, 8:52 AM   

## 2017-07-15 ENCOUNTER — Encounter
Admission: RE | Admit: 2017-07-15 | Discharge: 2017-07-15 | Disposition: A | Discharge status: Home / Self Care | Payer: Self-pay | Source: Ambulatory Visit | Attending: Surgery | Admitting: Surgery

## 2017-07-15 ENCOUNTER — Other Ambulatory Visit: Payer: Self-pay

## 2017-07-15 ENCOUNTER — Telehealth: Payer: Self-pay | Admitting: Surgery

## 2017-07-15 DIAGNOSIS — Z01818 Encounter for other preprocedural examination: Secondary | ICD-10-CM | POA: Insufficient documentation

## 2017-07-15 NOTE — Patient Instructions (Signed)
Your procedure is scheduled on: Wednesday, MAY 1st Su procedimiento est programado para:  Report to THE SECOND FLOOR OF THE MEDICAL MALL.            DO NOT STOP ON THE FIRST FLOOR Presntese a:  To find out your arrival time please call [phone] between 1PM - 3PM on Tuesday, April 30th Para saber su hora de llegada por favor llame al [phone] entre la 1PM - 3PM el da:  Remember: Instructions that are not followed completely may result in serious medical  risk, up to and including death, or upon the discretion of your surgeon and  anesthesiologist your surgery may need to be rescheduled.  Recuerde: Las instrucciones que no se siguen completamente Armed forces logistics/support/administrative officer en un  riesgo de salud grave, incluyendo hasta la [place] o a discrecin de su cirujano y Scientific laboratory technician,  su ciruga se puede posponer.    __X_ 1.Do not eat food after midnight the night before your procedure. No gum chewing or hard candies.   You may drink clear liquids up to 2 hours before you are scheduled to arrive for your surgery-  DO not drink clear liquids within 2 hours of the start of your surgery.     Clear Liquids include:    water, apple juice without pulp, clear carbohydrate drink such as Clearfast of Gatorade, Black Coffee or Tea                                (Do not add anything to coffee or tea).      No coma nada despus de la medianoche de la noche anterior a su procedimiento. No coma chicles ni caramelos duros. Puede tomar   lquidos claros hasta 2 horas antes de su hora programada de llegada al hospital para su procedimiento. No tome lquidos claros durante el transcurso de las 2 horas de su llegada programada al hospital para su procedimiento, ya que esto puede llevar a que su procedimiento se retrase o tenga que volver a Magazine features editor.  Los lquidos claros incluyen:         - Agua o jugo de Fidelis sin pulpa         - Bebidas claras con carbohidratos como ClearFast o Gatorade         - Caf  negro o t claro (sin leche, sin cremas, no agregue nada al caf ni al  t)  No tome nada que no est en esta lista.  Los pacientes con diabetes tipo 1 y tipo 2 solo deben Printmaker.  Llame a la clnica de PreCare o a la unidad de Same Day Surgery si tiene alguna pregunta sobre estas instrucciones.              _X__ 2.Do Not Smoke or use e-cigarettes For 24 Hours Prior to Your  Surgery.                           Do not use any chewable tobacco products for at least 6 hours prior to surgery.    No fume ni use cigarrillos electrnicos durante las 24 horas previas a su [place].  No use ningn producto de tabaco masticable durante al menos 6 horas antes de la [place].     __X_ 3. No alcohol for 24 hours before or after surgery.    No tome alcohol durante las 24 horas antes ni  despus de la ciruga.   ____4. Bring all medications with you on the day of surgery if instructed.    Lleve todos los medicamentos con usted el da de su ciruga si se le ha indicado as.   _X___ 5. Notify your doctor if there is any change in your medical condition (cold, fever, infections).    Informe a su mdico si hay algn cambio en su condicin mdica (resfriado, fiebre, infecciones).   Do not wear jewelry, make-up, hairpins, clips or nail polish.  No use joyas, maquillajes, pinzas/ganchos para el cabello ni esmalte de uas.  Do not wear lotions, powders, or perfumes. You may wear deodorant.  No use lociones, polvos o perfumes.  Puede usar desodorante.    Do not shave 48 hours prior to surgery. Men may shave face and neck.  No se afeite 48 horas antes de la [place].  Los hombres pueden [employer] cara  y el cuello.   Do not bring valuables to the hospital.   No lleve objetos de valor al hospital.  [facility] is not responsible for any belongings or valuables.  [place] no se hace responsable de ningn tipo de pertenencias objetos de valor.               Contacts, dentures or bridgework may not be worn  into surgery.  Los lentes de [place], las dentaduras postizas o puentes no se pueden usar en la [place].  Leave your suitcase in the car. After surgery it may be brought to your room.  Deje su maleta en el auto.  Despus de la ciruga podr traerla a su habitacin.  For patients admitted to the hospital, discharge time is determined by your treatment team.  Para los pacientes que sean ingresados al hospital, el tiempo en el cual se le dar de alta es determinado por su equipo de [place].   Patients discharged the day of surgery will not be allowed to drive home. A los pacientes que se les da de alta el mismo da de la ciruga no se les permitir conducir a Higher education careers adviser.   Please read over the following fact sheets that you were given: Por favor lea las siguientes hojas de informacin que le dieron:   PREPARING FOR SURGERY  ____ Take these medicines the morning of surgery with A SIP OF WATER:          [employer] estas medicinas la maana de la ciruga con UN SORBO DE AGUA:  1.NONE  2.   3.   4.       5.  6.  ____ Fleet Enema (as directed)          Enema de Fleet (segn lo indicado)    _X___ Use CHG Soap as directed          Utilice el jabn de CHG segn lo indicado  ____ Use inhalers on the day of surgery          Use los inhaladores el da de la ciruga  ___ Stop ALL ASPIRIN PRODUCTS AS OF 07/17/17          Deje de tomar el Coumadin/Plavix/aspirina el da:  ____ Stop Anti-inflammatories AS OF 07/22/17          Deje de tomar antiinflamatorios el da:   ____ Stop supplements until after surgery            Deje de tomar suplementos hasta despus de la ciruga  ____ Bring C-Pap to the hospital  Lleve el C-Pap al hospital   WEAR COMFORTABLE CLOTHES, STRETCHY AROUND THE MIDDLE.  HAVE COLACE OR MIRALAX AT HOME FOR CONSTIPATION AFTER SURGERY    (these are stool softeners, not laxative)

## 2017-07-15 NOTE — Telephone Encounter (Signed)
Pt advised of pre op date/time and sx date. Sx: 07/24/17 with Dr Pabon--laparoscopic cholecystectomy-robot assisted.  Pre op: 07/15/17 @8 :45am--office interview.   Patient made aware to call [phone], between 1-3:00pm the day before surgery, to find out what time to arrive.     Dr Domenick BookbinderPabon--Please add surgery orders, due to procedure changing to Robot.

## 2017-07-15 NOTE — Addendum Note (Signed)
Addended by: Sterling BigPABON, Keysha Damewood F on: 07/15/2017 09:55 PM   Modules accepted: Orders, SmartSet

## 2017-07-17 ENCOUNTER — Ambulatory Visit: Payer: Self-pay | Attending: Nurse Practitioner | Admitting: Nurse Practitioner

## 2017-07-17 ENCOUNTER — Encounter: Payer: Self-pay | Admitting: Nurse Practitioner

## 2017-07-17 VITALS — BP 108/75 | HR 73 | Temp 98.8°F | Ht 60.0 in | Wt 166.8 lb

## 2017-07-17 DIAGNOSIS — Z01419 Encounter for gynecological examination (general) (routine) without abnormal findings: Secondary | ICD-10-CM | POA: Insufficient documentation

## 2017-07-17 NOTE — Patient Instructions (Signed)
Prueba de Papanicolaou (Pap Test) POR QU ME DEBO REALIZAR ESTA PRUEBA? A esta prueba tambin se la denomina "frotis de Pap". Es una prueba de deteccin que se utiliza para detectar signos de cncer de vagina, cuello del tero y tero. La prueba tambin puede identificar la presencia de infeccin o cambios precancerosos. El mdico probablemente le recomiende que se realice esta prueba en forma regular. Esta prueba puede realizarse de la siguiente manera:  Cada 3 aos, a partir de los 21 aos.  Cada 5 aos, en combinacin con las pruebas que se realizan para detectar la presencia del virus del papiloma humano (VPH).  Con mayor o menor frecuencia, en funcin de otras enfermedades que tenga. QU TIPO DE MUESTRA SE TOMA? El mdico utilizar un pequeo hisopo de algodn, una esptula de plstico o un cepillo para recolectar una muestra de clulas de la superficie del cuello del tero. El cuello del tero es la apertura del tero, que tambin se conoce como matriz. Tambin pueden recolectarse las secreciones del cuello del tero y la vagina. CMO DEBO PREPARARME PARA ESTA PRUEBA?  Tenga en cuenta en qu etapa del ciclo menstrual se encuentra. Es posible que deba reprogramar la prueba si est menstruando el da en que debe realizrsela.  Si el da en que debe realizarse la prueba tiene una infeccin vaginal aparente, deber reprogramar la prueba.  Pueden pedirle que evite tomar una ducha o bao el da de la prueba o el da anterior.  Algunos medicamentos pueden provocar resultados anormales de la prueba, como los digitlicos y la tetraciclina. Si toma alguno de estos medicamentos, hable con su mdico antes de realizarse la prueba. QU SIGNIFICAN LOS RESULTADOS? Los resultados anormales de la prueba pueden indicar diversas enfermedades. Estas pueden incluir lo siguiente:  Cncer. Si bien los resultados de la prueba de Papanicolaou no pueden utilizarse para diagnosticar cncer de cuello del tero,  de vagina o de tero, pueden indicar que existe una posibilidad de presencia de cncer. En este caso, ser necesario realizar pruebas adicionales para determinar la presencia de cncer.  Enfermedad de transmisin sexual.  Infecciones por hongos.  Infeccin por parsitos.  Infeccin por herpes.  Una enfermedad que causa o favorece la infertilidad. Es su responsabilidad retirar el resultado del estudio. Consulte en el laboratorio o en el departamento en el que fue realizado el estudio cundo y cmo podr obtener los resultados. Comunquese con el mdico si tiene preguntas sobre los resultados. Esta informacin no tiene como fin reemplazar el consejo del mdico. Asegrese de hacerle al mdico cualquier pregunta que tenga. Document Released: 08/29/2007 Document Revised: 04/02/2014 Document Reviewed: 08/03/2013 Elsevier Interactive Patient Education  2018 Elsevier Inc.  Prevencin del cncer de cuello uterino Preventing Cervical Cancer El cncer de cuello uterino es un tipo de cncer que se desarrolla en el cuello uterino. El cuello uterino est en la parte inferior del tero. Conecta el tero con la vagina. El tero es el lugar donde el beb se desarrolla durante el embarazo. El cncer ocurre cuando algunas clulas comienzan a tener anomalas y crecen de forma descontrolada. El cncer de cuello uterino se desarrolla lentamente y podra no causar ningn sntoma al principio. Con el paso del tiempo, el cncer puede desarrollarse a mayor profundidad en el tejido del cuello uterino y expandirse a otras zonas. Si se halla a tiempo, el cncer de cuello uterino puede tratarse de forma eficaz. Tambin puede tomar medidas para prevenir este tipo de cncer. La mayora de los casos de cncer de   cuello uterino son causados por una ITS (infeccin de transmisin sexual) a raz de un virus llamado virus del papiloma humano (VPH). Una forma de disminuir el riesgo de desarrollar cncer de cuello uterino es evitar la  infeccin por el VPH. Para ello, debe practicar sexo con proteccin y colocarse la vacuna contra el VPH. Tambin es importante que se realice pruebas de Papanicolaou con regularidad, ya que estas pueden ayudar a identificar cambios en las clulas que podran provocar cncer. Las posibilidades de desarrollar esta enfermedad tambin pueden reducirse realizando ciertos cambios en el estilo de vida. Cmo puedo protegerme contra el cncer de cuello uterino? Prevencin contra la infeccin por VPH  Pregntele a su mdico acerca de cmo recibir la vacuna contra el VPH. Si tiene 26aos o menos, es posible que deba colocarse esta vacuna, que se administra en tres dosis durante 6meses. Esta vacuna la protege contra los tipos de VPH que podran provocar cncer.  Limite la cantidad de personas con las que mantiene relaciones sexuales. Adems, evite tener sexo con personas que han tenido demasiadas parejas sexuales.  Use preservativos de ltex durante las relaciones sexuales. Realizacin de las pruebas de Papanicolaou  Desde los 21aos, realcese pruebas de Papanicolaou con regularidad. Hable con el mdico sobre la frecuencia con la que debe hacerse estas pruebas. ? La mayora de las mujeres que tienen entre 21y29aos deben realizarse una prueba de Papanicolaou cada 3aos. ? La mayora de las mujeres que tienen entre 30y65aos deben realizarse una prueba de Papanicolaou y una prueba del VPH cada 5aos. ? Las mujeres que presentan un mayor riesgo de desarrollar cncer de cuello uterino, como aquellas con un debilitamiento del sistema inmunitario o aquellas que han estado expuestas al frmaco dietilestilbestrol (DES), posiblemente deban realizarse las pruebas con mayor frecuencia. Realizacin de otros cambios en el estilo de vida  No consuma ningn producto que contenga nicotina o tabaco, como cigarrillos y cigarrillos electrnicos. Si necesita ayuda para dejar de fumar, consulte al mdico.  Consuma,  como mnimo, 5porciones de frutas y verduras todos los das.  Baje de peso si es necesario. Por qu son importantes estos cambios?  Con estos cambios y pruebas de deteccin, se abordan los factores que, segn los expertos, aumentan el riesgo de padecer cncer de cuello uterino. La mejor forma de disminuir el riesgo es seguir estos pasos.  Realizarse pruebas de Papanicolaou con regularidad ayudar a identificar cambios en las clulas que podran provocar cncer. Luego, se podrn tomar medidas para evitar el desarrollo del cncer.  Estos cambios tambin ayudarn a detectar el cncer de cuello uterino a tiempo. Si se halla a tiempo, el cncer de cuello uterino puede tratarse de forma eficaz. Puede ser ms peligroso y difcil de tratar si el cncer se ha desarrollado a mayor profundidad en el cuello uterino o si se ha expandido.  Adems de hacer que sea menos propenso a padecer cncer de cuello uterino, estos cambios tambin le otorgarn otros beneficios a su salud, como los que se describen a continuacin: ? Practicar sexo con proteccin es importante para prevenir las ITS y los embarazos no deseados. ? Evitar el consumo de tabaco puede disminuir el riesgo de padecer otros tipos de cncer y problemas cardacos. ? Seguir una dieta saludable y mantener un peso saludable son buenos para su salud general. Qu puede suceder si no se hacen cambios? En su etapa inicial, el cncer de cuello uterino posiblemente no tenga sntomas. El cncer puede tardar varios aos en desarrollarse y expandirse a mayor   profundidad en el tejido del cuello uterino. Esta situacin podra ocurrir sin que usted lo sepa. Si desarrolla algn sntoma, como dolor en la pelvis o alguna secrecin o sangrado inusuales de la vagina, debe consultar con su mdico de inmediato. Si el cncer de cuello uterino no se halla a tiempo, podra tener que someterse a ciertos tratamientos, como radiacin, quimioterapia o ciruga. En algunos casos,  realizarse una ciruga podra implicar que no podr quedar embarazada o completar un embarazo con xito. Dnde encontrar apoyo: Hable con el mdico, el enfermero de la escuela o el departamento de salud de su localidad para obtener orientacin sobre los estudios de deteccin y la vacunacin. Es posible que algunos nios y adolescentes puedan colocarse la vacuna contra el VPH sin cargo por medio del programa Vacunas para Nios (Vaccines for Children,VFC) del gobierno de [place] Otros lugares en los que se vacuna:  Clnicas de salud pblica. Pregunte al departamento de salud de su localidad.  Centros de salud federalmente calificados, donde solo debera pagar lo que est a su alcance. Para encontrar alguno cerca de su lugar de residencia, consulte este sitio web: www.fqhc.org/find-an-fqhc/.  Clnicas de salud rural. Estas son parte de un programa para pacientes de Medicare y de Medicaid que viven en zonas rurales.  El Programa Nacional de Deteccin Temprana del Cncer de Mama y de Cuello Uterino de [place] tambin proporciona exmenes de deteccin y servicios de diagnstico de estos tipos de cncer a mujeres de bajos recursos, sin cobertura mdica o con coberturas inadecuadas. El cncer de cuello uterino puede transmitirse de madres a hijas. Hable con el mdico o el asesor gentico para obtener ms informacin sobre las pruebas genticas para el cncer. Dnde encontrar ms informacin: Puede obtener ms informacin sobre el cncer de cuello uterino en los siguientes sitios:  Colegio Americano de Ginecologa y Obstetricia (American College of Gynecology): www.acog.org/Patients/FAQs/Cervical-Cancer  Sociedad Americana contra el Cncer (American Cancer Society): www.cancer.org/cancer/cervicalcancer/  Centros para el Control y la Prevencin de Enfermedades(Centers for Disease Control and Prevention, CDC):www.cdc.gov/cancer/cervical/  Resumen  Converse con su mdico acerca de cmo recibir la  vacuna contra el VPH.  Asegrese de realizarse pruebas de Papanicolaou con regularidad segn las recomendaciones del mdico.  Si siente algn dolor en la pelvis o tiene alguna secrecin o sangrado inusuales de la vagina, consulte con su mdico de inmediato. Esta informacin no tiene como fin reemplazar el consejo del mdico. Asegrese de hacerle al mdico cualquier pregunta que tenga. Document Released: 06/20/2016 Document Revised: 06/20/2016 Document Reviewed: 03/27/2015 Elsevier Interactive Patient Education  2018 Elsevier Inc.  

## 2017-07-17 NOTE — Progress Notes (Signed)
Assessment & Plan:  Katrina FabianGriselda was seen today for gynecologic exam.  Diagnoses and all orders for this visit:  Well woman exam with routine gynecological exam -     Cytology - PAP    Patient has been counseled on age-appropriate routine health concerns for screening and prevention. These are reviewed and up-to-date. Referrals have been placed accordingly. Immunizations are up-to-date or declined.    Subjective:   Chief Complaint  Patient presents with  . Gynecologic Exam    Pt. is here for a pap smear. Pt. would like to do a diabetes screening.   VRI was used to communicate directly with patient for the entire encounter including providing detailed patient instructions.    HPI Katrina Carter 37 y.o. female presents to office today for PAP smear. She has cholecystectomy planned in the next few weeks. She is requesting blood work however she has lab results from 7 days ago. She does not need labs redrawn today. She verbalized understanding.   Review of Systems  Constitutional: Negative.  Negative for chills, fever, malaise/fatigue and weight loss.  Respiratory: Negative.  Negative for cough, shortness of breath and wheezing.   Cardiovascular: Negative.  Negative for chest pain, orthopnea and leg swelling.  Gastrointestinal: Negative for abdominal pain.  Genitourinary: Negative.  Negative for flank pain and hematuria.  Skin: Negative.  Negative for rash.  Psychiatric/Behavioral: Negative for suicidal ideas.    Past Medical History:  Diagnosis Date  . Abdominal pain   . Cholelithiasis   . Gastritis     Past Surgical History:  Procedure Laterality Date  . NO PAST SURGERIES      Family History  Problem Relation Age of Onset  . Hypertension Mother   . Diabetes Mother   . Thyroid disease Mother   . Hypertension Father   . Anesthesia problems Neg Hx   . Hypotension Neg Hx   . Malignant hyperthermia Neg Hx   . Pseudochol deficiency Neg Hx     Social History  Reviewed with no changes to be made today.   Outpatient Medications Prior to Visit  Medication Sig Dispense Refill  . ibuprofen (ADVIL,MOTRIN) 800 MG tablet Take 1 tablet (800 mg total) by mouth every 8 (eight) hours as needed. (Patient not taking: Reported on 07/17/2017) 30 tablet 0   No facility-administered medications prior to visit.     No Known Allergies     Objective:    Wt 166 lb 12.8 oz (75.7 kg)   LMP 07/08/2017 (Approximate)   BMI 28.63 kg/m  Wt Readings from Last 3 Encounters:  07/17/17 166 lb 12.8 oz (75.7 kg)  07/15/17 167 lb (75.8 kg)  07/10/17 167 lb 3.2 oz (75.8 kg)    Physical Exam  Constitutional: She is oriented to person, place, and time. She appears well-developed and well-nourished.  HENT:  Head: Normocephalic.  Cardiovascular: Normal rate, regular rhythm and normal heart sounds.  Pulmonary/Chest: Effort normal and breath sounds normal.  Abdominal: Soft. Bowel sounds are normal. Hernia confirmed negative in the right inguinal area and confirmed negative in the left inguinal area.  Genitourinary: Rectum normal, vagina normal and uterus normal. Rectal exam shows no external hemorrhoid. No labial fusion. There is no rash, tenderness, lesion or injury on the right labia. There is no rash, tenderness, lesion or injury on the left labia. Uterus is not deviated and not enlarged. Cervix exhibits no motion tenderness and no friability. Right adnexum displays no mass, no tenderness and no fullness. Left adnexum displays no  mass, no tenderness and no fullness. No erythema, tenderness or bleeding in the vagina. No foreign body in the vagina. No signs of injury around the vagina. No vaginal discharge found.  Lymphadenopathy: No inguinal adenopathy noted on the right or left side.       Right: No inguinal adenopathy present.       Left: No inguinal adenopathy present.  Neurological: She is alert and oriented to person, place, and time.  Skin: Skin is warm and dry.    Psychiatric: She has a normal mood and affect. Her behavior is normal. Judgment and thought content normal.       Patient has been counseled extensively about nutrition and exercise as well as the importance of adherence with medications and regular follow-up. The patient was given clear instructions to go to ER or return to medical center if symptoms don't improve, worsen or new problems develop. The patient verbalized understanding.   Follow-up: No follow-ups on file.   Claiborne Rigg, FNP-BC [facility] [place], [place] [phone]   07/17/2017, 3:11 PM

## 2017-07-23 LAB — CYTOLOGY - PAP
BACTERIAL VAGINITIS: POSITIVE — AB
Candida vaginitis: NEGATIVE
Chlamydia: NEGATIVE
DIAGNOSIS: UNDETERMINED — AB
HPV: NOT DETECTED
Neisseria Gonorrhea: NEGATIVE
Trichomonas: NEGATIVE

## 2017-07-23 MED ORDER — CEFAZOLIN SODIUM-DEXTROSE 2-4 GM/100ML-% IV SOLN
2.0000 g | INTRAVENOUS | Status: AC
  Administered 2017-07-24: 2 g via INTRAVENOUS

## 2017-07-24 ENCOUNTER — Encounter
Admission: RE | Disposition: A | Discharge status: Home / Self Care | Payer: Self-pay | Source: Ambulatory Visit | Attending: Surgery

## 2017-07-24 ENCOUNTER — Ambulatory Visit
Admission: RE | Admit: 2017-07-24 | Discharge: 2017-07-24 | Disposition: A | Discharge status: Home / Self Care | Payer: Self-pay | Source: Ambulatory Visit | Attending: Surgery | Admitting: Surgery

## 2017-07-24 ENCOUNTER — Ambulatory Visit: Payer: Self-pay | Admitting: Anesthesiology

## 2017-07-24 ENCOUNTER — Other Ambulatory Visit: Payer: Self-pay | Admitting: Nurse Practitioner

## 2017-07-24 DIAGNOSIS — K8010 Calculus of gallbladder with chronic cholecystitis without obstruction: Secondary | ICD-10-CM | POA: Insufficient documentation

## 2017-07-24 DIAGNOSIS — K812 Acute cholecystitis with chronic cholecystitis: Secondary | ICD-10-CM

## 2017-07-24 DIAGNOSIS — K429 Umbilical hernia without obstruction or gangrene: Secondary | ICD-10-CM | POA: Insufficient documentation

## 2017-07-24 HISTORY — PX: ROBOTIC ASSISTED LAPAROSCOPIC CHOLECYSTECTOMY: SHX6521

## 2017-07-24 HISTORY — PX: UMBILICAL HERNIA REPAIR: SHX196

## 2017-07-24 LAB — POCT PREGNANCY, URINE: Preg Test, Ur: NEGATIVE

## 2017-07-24 SURGERY — ROBOTIC ASSISTED LAPAROSCOPIC CHOLECYSTECTOMY
Anesthesia: General | Site: Abdomen | Wound class: Contaminated

## 2017-07-24 MED ORDER — DEXAMETHASONE SODIUM PHOSPHATE 10 MG/ML IJ SOLN
INTRAMUSCULAR | Status: DC | PRN
  Administered 2017-07-24: 6 mg via INTRAVENOUS

## 2017-07-24 MED ORDER — CEFAZOLIN SODIUM-DEXTROSE 2-4 GM/100ML-% IV SOLN
INTRAVENOUS | Status: AC
  Filled 2017-07-24: qty 100

## 2017-07-24 MED ORDER — ROCURONIUM BROMIDE 50 MG/5ML IV SOLN
INTRAVENOUS | Status: AC
  Filled 2017-07-24: qty 2

## 2017-07-24 MED ORDER — ROCURONIUM BROMIDE 100 MG/10ML IV SOLN
INTRAVENOUS | Status: DC | PRN
  Administered 2017-07-24: 80 mg via INTRAVENOUS

## 2017-07-24 MED ORDER — METRONIDAZOLE 500 MG PO TABS: 500.0000 mg | ORAL_TABLET | Freq: Two times a day (BID) | ORAL | 0 refills | Status: AC

## 2017-07-24 MED ORDER — FAMOTIDINE 20 MG PO TABS
ORAL_TABLET | ORAL | Status: AC
  Filled 2017-07-24: qty 1

## 2017-07-24 MED ORDER — BUPIVACAINE HCL 0.25 % IJ SOLN
INTRAMUSCULAR | Status: DC | PRN
  Administered 2017-07-24: 30 mL

## 2017-07-24 MED ORDER — FAMOTIDINE 20 MG PO TABS
20.0000 mg | ORAL_TABLET | Freq: Once | ORAL | Status: AC
  Administered 2017-07-24: 20 mg via ORAL

## 2017-07-24 MED ORDER — HYDROCODONE-ACETAMINOPHEN 5-325 MG PO TABS
1.0000 | ORAL_TABLET | Freq: Once | ORAL | Status: AC
  Administered 2017-07-24: 1 via ORAL

## 2017-07-24 MED ORDER — FENTANYL CITRATE (PF) 100 MCG/2ML IJ SOLN
INTRAMUSCULAR | Status: DC | PRN
  Administered 2017-07-24 (×2): 100 ug via INTRAVENOUS
  Administered 2017-07-24: 50 ug via INTRAVENOUS

## 2017-07-24 MED ORDER — KETOROLAC TROMETHAMINE 30 MG/ML IJ SOLN
INTRAMUSCULAR | Status: AC
  Filled 2017-07-24: qty 1

## 2017-07-24 MED ORDER — PROPOFOL 10 MG/ML IV BOLUS
INTRAVENOUS | Status: DC | PRN
  Administered 2017-07-24: 200 mg via INTRAVENOUS

## 2017-07-24 MED ORDER — MIDAZOLAM HCL 2 MG/2ML IJ SOLN
INTRAMUSCULAR | Status: DC | PRN
  Administered 2017-07-24: 2 mg via INTRAVENOUS

## 2017-07-24 MED ORDER — INDOCYANINE GREEN 25 MG IV SOLR
2.5000 mg | Freq: Once | INTRAVENOUS | Status: AC
  Administered 2017-07-24: 25 mg via INTRAVENOUS
  Filled 2017-07-24: qty 25

## 2017-07-24 MED ORDER — PROPOFOL 10 MG/ML IV BOLUS
INTRAVENOUS | Status: AC
Start: 2017-07-24 — End: ?
  Filled 2017-07-24: qty 20

## 2017-07-24 MED ORDER — SUGAMMADEX SODIUM 200 MG/2ML IV SOLN
INTRAVENOUS | Status: AC
  Filled 2017-07-24: qty 2

## 2017-07-24 MED ORDER — FENTANYL CITRATE (PF) 250 MCG/5ML IJ SOLN
INTRAMUSCULAR | Status: AC
  Filled 2017-07-24: qty 5

## 2017-07-24 MED ORDER — FENTANYL CITRATE (PF) 100 MCG/2ML IJ SOLN
25.0000 ug | INTRAMUSCULAR | Status: AC | PRN
  Administered 2017-07-24 (×6): 25 ug via INTRAVENOUS

## 2017-07-24 MED ORDER — BUPIVACAINE HCL (PF) 0.25 % IJ SOLN
INTRAMUSCULAR | Status: AC
  Filled 2017-07-24: qty 30

## 2017-07-24 MED ORDER — KETOROLAC TROMETHAMINE 30 MG/ML IJ SOLN
INTRAMUSCULAR | Status: DC | PRN
  Administered 2017-07-24: 30 mg via INTRAVENOUS

## 2017-07-24 MED ORDER — CHLORHEXIDINE GLUCONATE CLOTH 2 % EX PADS: 6.0000 | MEDICATED_PAD | Freq: Once | CUTANEOUS | Status: DC

## 2017-07-24 MED ORDER — LIDOCAINE HCL (PF) 2 % IJ SOLN
INTRAMUSCULAR | Status: AC
  Filled 2017-07-24: qty 10

## 2017-07-24 MED ORDER — HYDROCODONE-ACETAMINOPHEN 5-325 MG PO TABS: 1.0000 | ORAL_TABLET | Freq: Four times a day (QID) | ORAL | 0 refills | Status: DC | PRN

## 2017-07-24 MED ORDER — MIDAZOLAM HCL 2 MG/2ML IJ SOLN
INTRAMUSCULAR | Status: AC
  Filled 2017-07-24: qty 2

## 2017-07-24 MED ORDER — INDOCYANINE GREEN 25 MG IV SOLR
INTRAVENOUS | Status: AC
  Filled 2017-07-24: qty 25

## 2017-07-24 MED ORDER — ONDANSETRON HCL 4 MG/2ML IJ SOLN
INTRAMUSCULAR | Status: DC | PRN
  Administered 2017-07-24: 4 mg via INTRAVENOUS

## 2017-07-24 MED ORDER — FENTANYL CITRATE (PF) 100 MCG/2ML IJ SOLN
INTRAMUSCULAR | Status: AC
  Administered 2017-07-24: 25 ug via INTRAVENOUS
  Filled 2017-07-24: qty 2

## 2017-07-24 MED ORDER — ONDANSETRON HCL 4 MG/2ML IJ SOLN
INTRAMUSCULAR | Status: AC
  Filled 2017-07-24: qty 2

## 2017-07-24 MED ORDER — ONDANSETRON HCL 4 MG/2ML IJ SOLN
4.0000 mg | Freq: Once | INTRAMUSCULAR | Status: AC | PRN
  Administered 2017-07-24: 4 mg via INTRAVENOUS

## 2017-07-24 MED ORDER — LIDOCAINE HCL (CARDIAC) PF 100 MG/5ML IV SOSY
PREFILLED_SYRINGE | INTRAVENOUS | Status: DC | PRN
  Administered 2017-07-24: 100 mg via INTRAVENOUS

## 2017-07-24 MED ORDER — SUGAMMADEX SODIUM 200 MG/2ML IV SOLN
INTRAVENOUS | Status: DC | PRN
  Administered 2017-07-24: 200 mg via INTRAVENOUS

## 2017-07-24 MED ORDER — LACTATED RINGERS IV SOLN
INTRAVENOUS | Status: DC
  Administered 2017-07-24 (×2): via INTRAVENOUS

## 2017-07-24 MED ORDER — ONDANSETRON HCL 4 MG/2ML IJ SOLN
INTRAMUSCULAR | Status: AC
  Administered 2017-07-24: 4 mg via INTRAVENOUS
  Filled 2017-07-24: qty 2

## 2017-07-24 MED ORDER — HYDROCODONE-ACETAMINOPHEN 5-325 MG PO TABS
ORAL_TABLET | ORAL | Status: AC
  Administered 2017-07-24: 1 via ORAL
  Filled 2017-07-24: qty 1

## 2017-07-24 SURGICAL SUPPLY — 48 items
"PENCIL ELECTRO HAND CTR " (MISCELLANEOUS) ×1 IMPLANT
ADH SKN CLS APL DERMABOND .7 (GAUZE/BANDAGES/DRESSINGS) ×1
CANISTER SUCT 1200ML W/VALVE (MISCELLANEOUS) ×3 IMPLANT
CANNULA SEALS 8.5MM (CANNULA)
CHLORAPREP W/TINT 26ML (MISCELLANEOUS) ×3 IMPLANT
CLIP VESOLOCK MED LG 6/CT (CLIP) ×6 IMPLANT
CORD BIP STRL DISP 12FT (MISCELLANEOUS) ×3 IMPLANT
COVER TIP SHEARS 8 DVNC (MISCELLANEOUS) ×1 IMPLANT
COVER TIP SHEARS 8MM DA VINCI (MISCELLANEOUS)
DECANTER SPIKE VIAL GLASS SM (MISCELLANEOUS) ×3 IMPLANT
DEFOGGER SCOPE WARMER CLEARIFY (MISCELLANEOUS) ×3 IMPLANT
DERMABOND ADVANCED (GAUZE/BANDAGES/DRESSINGS) ×2
DERMABOND ADVANCED .7 DNX12 (GAUZE/BANDAGES/DRESSINGS) ×1 IMPLANT
DRAPE 3 ARM ACCESS DA VINCI (DRAPES) ×2
DRAPE 3 ARM ACCESS DVNC (DRAPES) ×1 IMPLANT
DRAPE SHEET LG 3/4 BI-LAMINATE (DRAPES) ×9 IMPLANT
DRSG TEGADERM 2-3/8X2-3/4 SM (GAUZE/BANDAGES/DRESSINGS) ×4 IMPLANT
DRSG TELFA 3X8 NADH (GAUZE/BANDAGES/DRESSINGS) ×3 IMPLANT
ELECT REM PT RETURN 9FT ADLT (ELECTROSURGICAL) ×3
ELECTRODE REM PT RTRN 9FT ADLT (ELECTROSURGICAL) ×1 IMPLANT
GLOVE BIO SURGEON STRL SZ7 (GLOVE) ×12 IMPLANT
GLOVE INDICATOR 7.5 STRL GRN (GLOVE) ×1 IMPLANT
GOWN STRL REUS W/ TWL LRG LVL3 (GOWN DISPOSABLE) ×4 IMPLANT
GOWN STRL REUS W/TWL LRG LVL3 (GOWN DISPOSABLE) ×12
IRRIGATION STRYKERFLOW (MISCELLANEOUS) ×1 IMPLANT
IRRIGATOR STRYKERFLOW (MISCELLANEOUS)
IV NS 1000ML (IV SOLUTION) ×3
IV NS 1000ML BAXH (IV SOLUTION) ×1 IMPLANT
KIT PINK PAD W/HEAD ARE REST (MISCELLANEOUS) ×3
KIT PINK PAD W/HEAD ARM REST (MISCELLANEOUS) ×1 IMPLANT
LABEL OR SOLS (LABEL) ×3 IMPLANT
NEEDLE HYPO 22GX1.5 SAFETY (NEEDLE) ×3 IMPLANT
NS IRRIG 500ML POUR BTL (IV SOLUTION) ×3 IMPLANT
PACK LAP CHOLECYSTECTOMY (MISCELLANEOUS) ×3 IMPLANT
PAD DRESSING TELFA 3X8 NADH (GAUZE/BANDAGES/DRESSINGS) ×1 IMPLANT
PENCIL ELECTRO HAND CTR (MISCELLANEOUS) ×3 IMPLANT
POUCH ENDO CATCH 10MM SPEC (MISCELLANEOUS) ×3 IMPLANT
SEAL CANN 8.5 DVNC (CANNULA) ×1 IMPLANT
SOLUTION ELECTROLUBE (MISCELLANEOUS) ×3 IMPLANT
STRAP SAFETY 5IN WIDE (MISCELLANEOUS) ×3 IMPLANT
SUT ETHIBOND 0 MO6 C/R (SUTURE) ×1 IMPLANT
SUT MNCRL 4-0 (SUTURE)
SUT MNCRL 4-018XMFL (SUTURE)
SUT MNCRL AB 4-0 PS2 18 (SUTURE) ×5 IMPLANT
SUT VICRYL 0 AB UR-6 (SUTURE) ×5 IMPLANT
SUTURE MNCRL 4-018XMF (SUTURE) ×1 IMPLANT
TROCAR BALLN GELPORT 12X130M (ENDOMECHANICALS) ×3 IMPLANT
TUBING INSUF HEATED (TUBING) ×3 IMPLANT

## 2017-07-24 NOTE — Transfer of Care (Signed)
Immediate Anesthesia Transfer of Care Note  Patient: Katrina Carter  Procedure(s) Performed: ROBOTIC ASSISTED LAPAROSCOPIC CHOLECYSTECTOMY (N/A Abdomen) HERNIA REPAIR UMBILICAL ADULT (N/A Abdomen)  Patient Location: PACU  Anesthesia Type:General  Level of Consciousness: awake and patient cooperative  Airway & Oxygen Therapy: Patient Spontanous Breathing and Patient connected to nasal cannula oxygen  Post-op Assessment: Report given to RN and Post -op Vital signs reviewed and stable  Post vital signs: Reviewed and stable  Last Vitals:  Vitals Value Taken Time  BP 118/50 07/24/2017  1:37 PM  Temp    Pulse 91 07/24/2017  1:37 PM  Resp 23 07/24/2017  1:37 PM  SpO2 100 % 07/24/2017  1:37 PM  Vitals shown include unvalidated device data.  Last Pain: There were no vitals filed for this visit.       Complications: No apparent anesthesia complications

## 2017-07-24 NOTE — Interval H&P Note (Signed)
History and Physical Interval Note:  07/24/2017 10:48 AM  Katrina Carter  has presented today for surgery, with the diagnosis of ABDOMINAL PAIN RIGHT UPPER QUADRANT  The various methods of treatment have been discussed with the patient and family. After consideration of risks, benefits and other options for treatment, the patient has consented to  Procedure(s): ROBOTIC ASSISTED LAPAROSCOPIC CHOLECYSTECTOMY (N/A) as a surgical intervention .  The patient's history has been reviewed, patient examined, no change in status, stable for surgery.  I have reviewed the patient's chart and labs.  Questions were answered to the patient's satisfaction.     [name]

## 2017-07-24 NOTE — Discharge Instructions (Signed)

## 2017-07-24 NOTE — Anesthesia Post-op Follow-up Note (Signed)
Anesthesia QCDR form completed.        

## 2017-07-24 NOTE — Op Note (Signed)
Robotic assisted Laparoscopic Cholecystectomy  Pre-operative Diagnosis: Chronic cholecystitis  Post-operative Diagnosis: Same   Procedure:  1. Robotic assisted laparoscopic cholecystectomy 2. Repair of umbilical hernia  Surgeon: Sterling Big, MD FACS  Anesthesia: Gen. with endotracheal tube  Findings: Acute on chronic cholecystitis  Hydrops of the gallbladder No evidence of bile leak intraoperatively after ICG cholangiography Umbilical hernia  Estimated Blood Loss: 10cc         Drains: none         Specimens: Gallbladder           Complications: none   Procedure Details  The patient was seen again in the Holding Room. The benefits, complications, treatment options, and expected outcomes were discussed with the patient. The risks of bleeding, infection, recurrence of symptoms, failure to resolve symptoms, bile duct damage, bile duct leak, retained common bile duct stone, bowel injury, any of which could require further surgery and/or ERCP, stent, or papillotomy were reviewed with the patient. The likelihood of improving the patient's symptoms with return to their baseline status is good.  The patient and/or family concurred with the proposed plan, giving informed consent.  The patient was taken to Operating Room, identified as Katrina Carter and the procedure verified as Laparoscopic Cholecystectomy.  A Time Out was held and the above information confirmed.  Prior to the induction of general anesthesia, antibiotic prophylaxis was administered. VTE prophylaxis was in place. General endotracheal anesthesia was then administered and tolerated well. After the induction, the abdomen was prepped with Chloraprep and draped in the sterile fashion. The patient was positioned in the supine position.  Cut down technique was used to enter the abdominal cavity, a hernia sac was encounter, we dissected the sac and excised it. A Hasson trochar was placed after two vicryl stitches were  anchored to the fascia. Pneumoperitoneum was then created with CO2 and tolerated well without any adverse changes in the patient's vital signs.  Two 8-mm ports were placed in the right upper quadrant and LUQ respectively, an additional 5 mm placed on the RUQ all under direct vision. All skin incisions  were infiltrated with a local anesthetic agent before making the incision and placing the trocars.   The patient was positioned  in reverse Trendelenburg, tilted slightly to the patient's left.   The robot was brought to the field and Dock in the standard fashion, we made sure there were no collision bteween the arms and at all times the instruments were kept under direct visualization.   The gallbladder was identified, the fundus grasped and retracted cephalad. Because of the giant size and distension of the GB I used a suction device to decompressed it and hydrops was found.  Adhesions were lysed bluntly. The infundibulum was grasped and retracted laterally, exposing the peritoneum overlying the triangle of Calot. This was then divided and exposed in a blunt fashion. A stone was lodge within the infundibulum. An extended critical view of the cystic duct and cystic artery was obtained.  The cystic duct was clearly identified and bluntly dissected.   Artery and duct were double clipped and divided. We used ICG cholangiography to made sure there were no aberrant ducts or bile leaks.  The gallbladder was taken from the gallbladder fossa in a retrograde fashion with the electrocautery. The gallbladder was removed and placed in an Endocatch bag. The liver bed was irrigated and inspected. Hemostasis was achieved with the electrocautery. Copious irrigation was utilized and was repeatedly aspirated until clear.  The gallbladder and Endocatch  sac were then removed through a port site.   Inspection of the right upper quadrant was performed. No bleeding, bile duct injury or leak, or bowel injury was noted. The  instruments and ports were removed and the robot undocked. Pneumoperitoneum was released.  The periumbilical hernia defect was closed with interrumpted 0 ethibond sutures. 4-0 subcuticular Monocryl was used to close the skin. Dermabond was  applied.  The patient was then extubated and brought to the recovery room in stable condition. Sponge, lap, and needle counts were correct at closure and at the conclusion of the case.               Sterling Big, MD, FACS

## 2017-07-24 NOTE — Anesthesia Preprocedure Evaluation (Signed)
Anesthesia Evaluation  Patient identified by MRN, date of birth, ID band Patient awake    Reviewed: Allergy & Precautions, H&P , NPO status , Patient's Chart, lab work & pertinent test results  Airway Mallampati: II  TM Distance: >3 FB Neck ROM: full    Dental  (+) Teeth Intact   Pulmonary neg pulmonary ROS,    breath sounds clear to auscultation       Cardiovascular negative cardio ROS   Rhythm:regular Rate:Normal     Neuro/Psych PSYCHIATRIC DISORDERS Depression negative neurological ROS     GI/Hepatic Neg liver ROS,   Endo/Other  negative endocrine ROS  Renal/GU negative Renal ROS  negative genitourinary   Musculoskeletal negative musculoskeletal ROS (+)   Abdominal   Peds negative pediatric ROS (+)  Hematology negative hematology ROS (+)   Anesthesia Other Findings Sore throat, and headache yesterday, but no fever.  Much better today with clear lungs and no fever.      Reproductive/Obstetrics                             Anesthesia Physical  Anesthesia Plan  ASA: II  Anesthesia Plan: General   Post-op Pain Management:    Induction: Intravenous  PONV Risk Score and Plan:   Airway Management Planned: Oral ETT  Additional Equipment:   Intra-op Plan:   Post-operative Plan: Extubation in OR  Informed Consent: I have reviewed the patients History and Physical, chart, labs and discussed the procedure including the risks, benefits and alternatives for the proposed anesthesia with the patient or authorized representative who has indicated his/her understanding and acceptance.   Dental Advisory Given  Plan Discussed with: CRNA and Surgeon  Anesthesia Plan Comments: ( )        Anesthesia Quick Evaluation

## 2017-07-24 NOTE — Anesthesia Procedure Notes (Signed)
Procedure Name: Intubation Date/Time: 07/24/2017 11:29 AM Performed by: Bernardo Heater, CRNA Pre-anesthesia Checklist: Patient identified, Emergency Drugs available, Suction available and Patient being monitored Patient Re-evaluated:Patient Re-evaluated prior to induction Oxygen Delivery Method: Circle system utilized Preoxygenation: Pre-oxygenation with 100% oxygen Induction Type: IV induction Laryngoscope Size: Mac and 3 Grade View: Grade I Tube type: Oral Tube size: 7.0 mm Number of attempts: 1 Placement Confirmation: ETT inserted through vocal cords under direct vision,  positive ETCO2 and breath sounds checked- equal and bilateral Secured at: 21 cm Tube secured with: Tape Dental Injury: Teeth and Oropharynx as per pre-operative assessment

## 2017-07-25 ENCOUNTER — Encounter: Payer: Self-pay | Admitting: Surgery

## 2017-07-25 MED FILL — metroNIDAZOLE 500 MG TABS: 500 | 7 days supply | Qty: 14 | Fill #0

## 2017-07-25 NOTE — Anesthesia Postprocedure Evaluation (Signed)
Anesthesia Post Note  Patient: Engineering geologist  Procedure(s) Performed: ROBOTIC ASSISTED LAPAROSCOPIC CHOLECYSTECTOMY (N/A Abdomen) HERNIA REPAIR UMBILICAL ADULT (N/A Abdomen)  Patient location during evaluation: PACU Anesthesia Type: General Level of consciousness: awake and alert and oriented Pain management: pain level controlled Vital Signs Assessment: post-procedure vital signs reviewed and stable Respiratory status: spontaneous breathing Cardiovascular status: blood pressure returned to baseline Anesthetic complications: no     Last Vitals:  Vitals:   07/24/17 1458 07/24/17 1542  BP: 108/62 (!) 106/58  Pulse: 72 71  Resp: 16 16  Temp: 36.8 C (!) 35.9 C  SpO2: 100% 100%    Last Pain:  Vitals:   07/24/17 1542  TempSrc: Temporal  PainSc:                  Rondia Higginbotham

## 2017-07-26 ENCOUNTER — Telehealth: Payer: Self-pay

## 2017-07-26 NOTE — Telephone Encounter (Signed)
-----   Message from Claiborne Rigg, NP sent at 07/24/2017 10:14 PM EDT ----- PAP smear is abnormal and will need to be repeated in one year based on gynecology guidelines for screening. The cells are abnormal on the pap smear and will need to have repeat testing in 12 months. Also you have bacterial vaginosis on your testing which is a bacterial infection in the vagina. Will send prescription to pharmacy.

## 2017-07-26 NOTE — Telephone Encounter (Signed)
CMA called patient to inform on pap smear results.  Patient understood and is aware to pick up her Rx. Patient verified DOB.  Spanish interpreter Donata Clay [phone] assist with the call.

## 2017-07-31 LAB — SURGICAL PATHOLOGY

## 2017-08-02 ENCOUNTER — Ambulatory Visit: Payer: Self-pay | Attending: Family Medicine

## 2017-08-02 MED FILL — CLOBETASOL 0.05% CREAM: 0.05 | 30 days supply | Qty: 30 | Fill #0

## 2017-08-02 NOTE — Progress Notes (Unsigned)
White spot on forehead

## 2017-08-08 ENCOUNTER — Ambulatory Visit: Payer: Self-pay | Admitting: Gastroenterology

## 2017-08-14 ENCOUNTER — Ambulatory Visit (INDEPENDENT_AMBULATORY_CARE_PROVIDER_SITE_OTHER): Payer: Self-pay | Admitting: Surgery

## 2017-08-14 ENCOUNTER — Encounter: Payer: Self-pay | Admitting: Surgery

## 2017-08-14 VITALS — BP 114/77 | HR 65 | Temp 97.7°F | Wt 159.0 lb

## 2017-08-14 DIAGNOSIS — Z09 Encounter for follow-up examination after completed treatment for conditions other than malignant neoplasm: Secondary | ICD-10-CM

## 2017-08-14 NOTE — Patient Instructions (Signed)
Por favor llamenos si tiene alguna pregunta. 

## 2017-08-15 ENCOUNTER — Encounter: Payer: Self-pay | Admitting: Surgery

## 2017-08-15 NOTE — Progress Notes (Signed)
S/p robotic chole Doing well Taking PO, no fevers or chills  PE NAD Abd: soft, incisions c/d/i, appropriate incisional tenderness, no peritonitis  A/P Doing well Path d/w pt No heavy lifting  RTC prn

## 2017-09-06 ENCOUNTER — Ambulatory Visit: Payer: Self-pay | Attending: Family Medicine

## 2018-10-03 IMAGING — US US ABDOMEN LIMITED
1 series · 13 of 25 positions shown · non-contrast
Comparison: 03/12/2017 ultrasound

CLINICAL DATA: 37-year-old female with right upper quadrant pain
for the past year. Subsequent encounter.

EXAM:
ULTRASOUND ABDOMEN LIMITED RIGHT UPPER QUADRANT

[Series 1: us abdomen limited · 0.26mm/px · 13 of 38 slices shown]
[im 1/38]
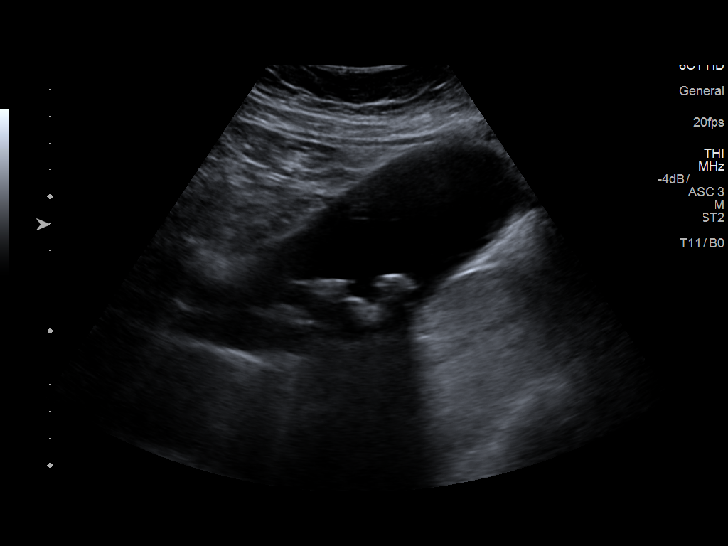
[im 4/38]
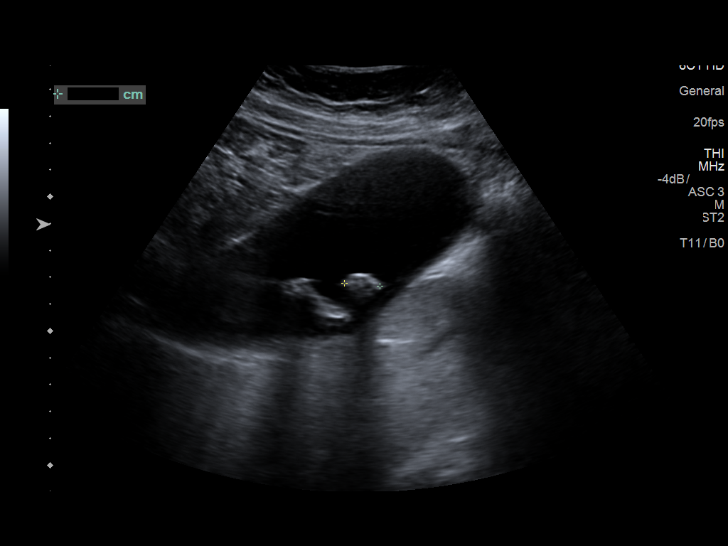
[im 7/38]
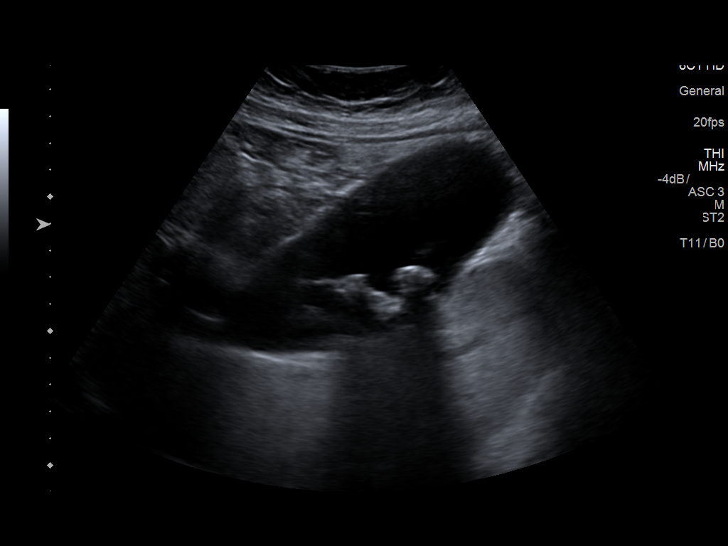
[im 10/38]
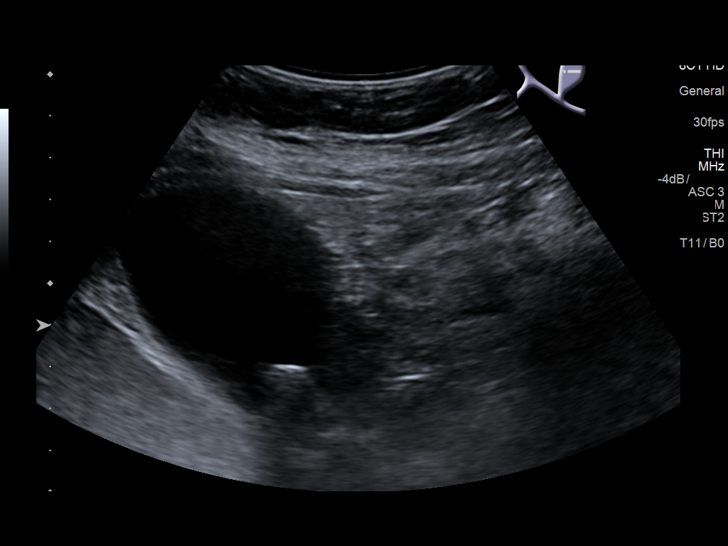
[im 13/38]
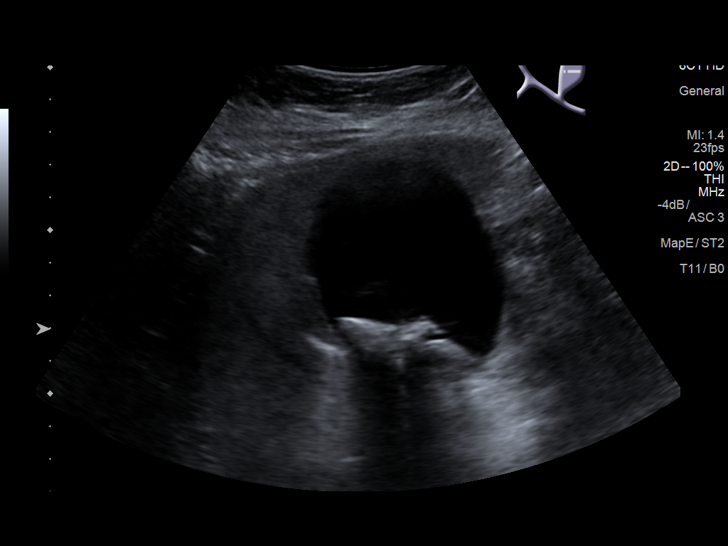
[im 16/38]
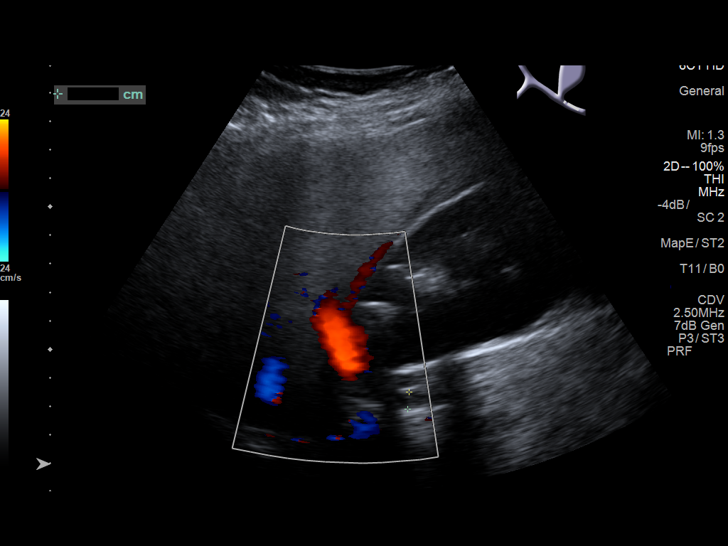
[im 19/38]
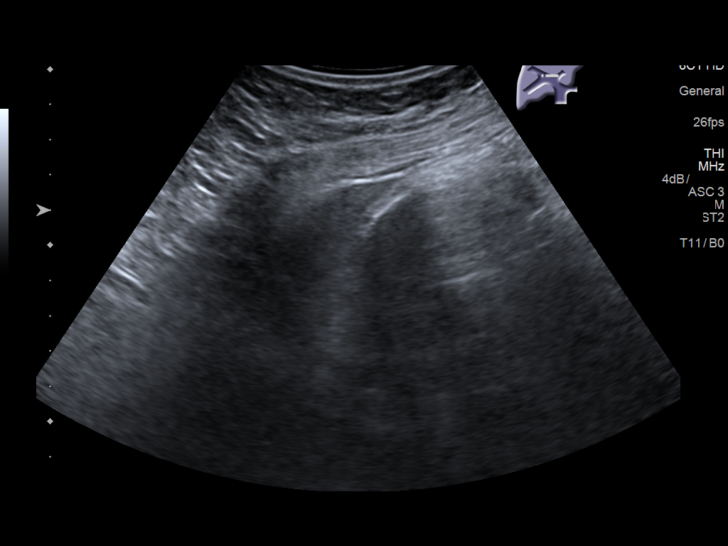
[im 22/38]
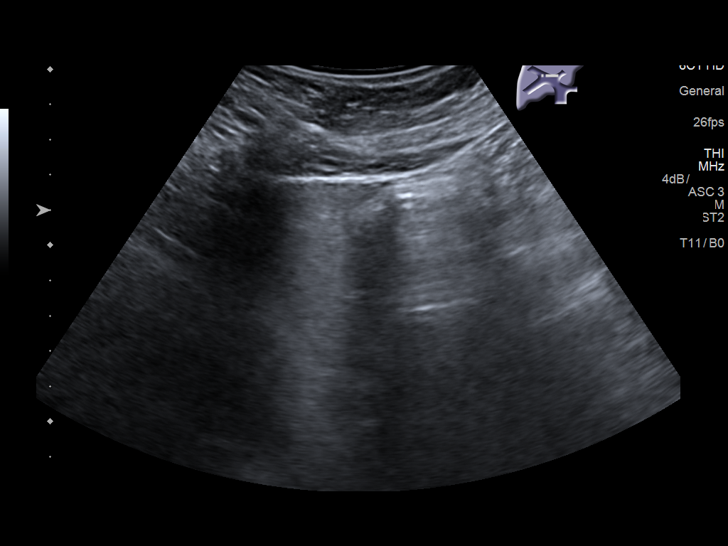
[im 25/38]
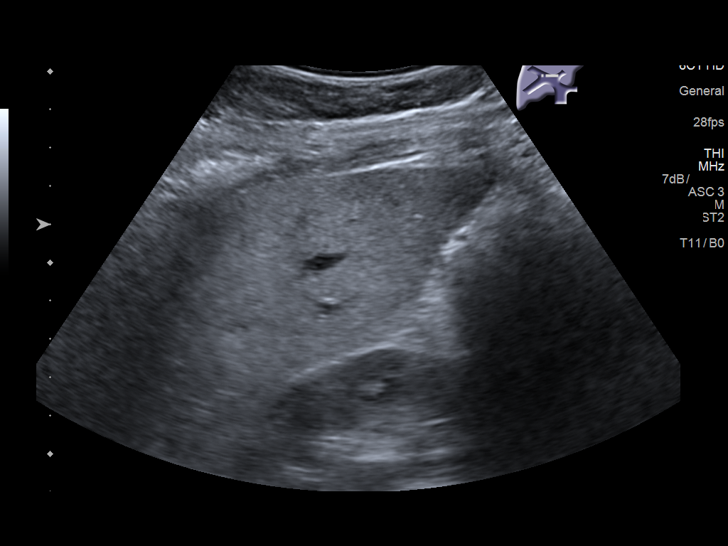
[im 28/38]
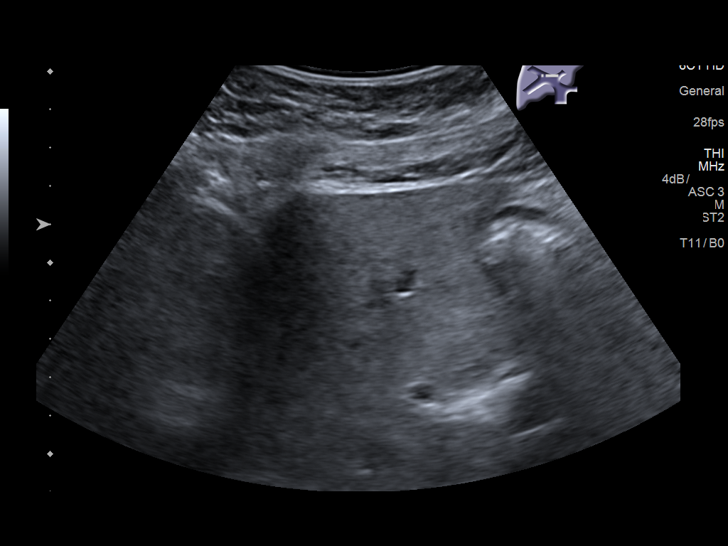
[im 31/38]
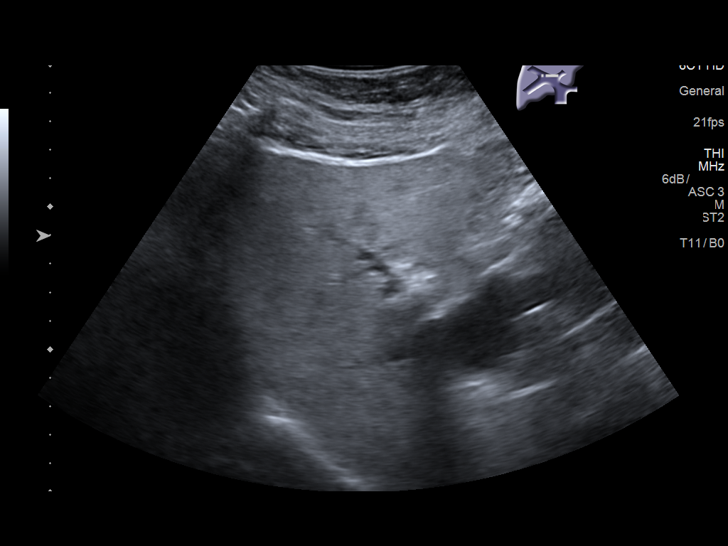
[im 34/38]
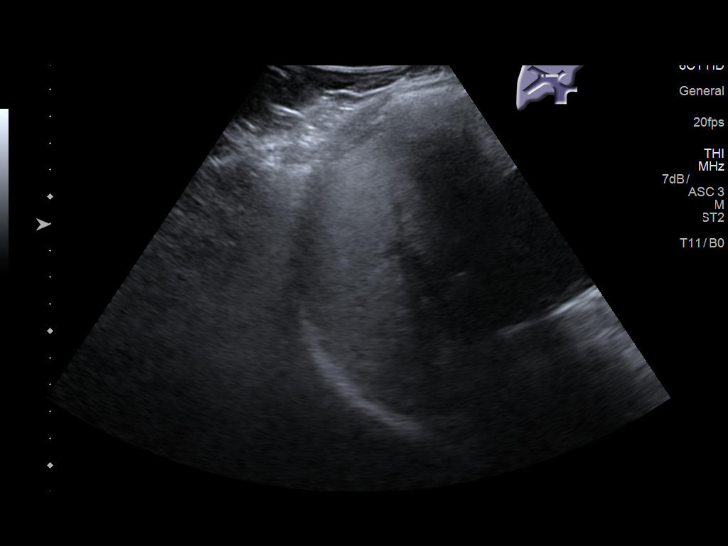
[im 38/38]
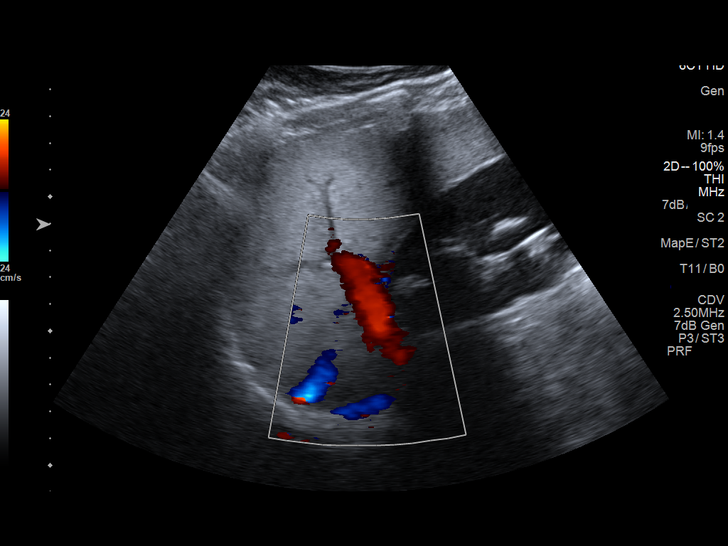

[13 of 25 positions shown; findings below may reference images not displayed]

FINDINGS: Gallbladder:

Dilated gallbladder containing mobile gallstones measuring up to
mm. No gallbladder wall thickening. Patient not tender over the
gallbladder during scanning per ultrasound technologist.

Common bile duct:

Diameter: Measures between 4.5 and 9.6 mm. Distal aspect not
visualized secondary to bowel gas. Portions visualized without
common bile duct stone.

Liver:

Liver of increased echogenicity consistent with fatty infiltration
and/or hepatocellular disease. No focal hepatic lesion. Portal vein
is patent on color Doppler imaging with normal direction of blood
flow towards the liver.
IMPRESSION: Dilated gallbladder containing mobile gallstones measuring up to
cm. No sonographic findings to suggest acute cholecystitis.

Common bile duct measures between 4.5 and 9.6 mm. Distal aspect not
visualized secondary to bowel gas. No common bile duct stone noted
within portions of the common bile duct which are visualized.

Liver of increased echogenicity consistent with fatty infiltration
and/or hepatocellular disease. No focal hepatic lesion.

## 2019-10-17 ENCOUNTER — Other Ambulatory Visit: Payer: Self-pay

## 2019-10-17 ENCOUNTER — Ambulatory Visit: Payer: Self-pay | Attending: Internal Medicine

## 2019-10-17 DIAGNOSIS — Z23 Encounter for immunization: Secondary | ICD-10-CM

## 2019-10-17 NOTE — Progress Notes (Signed)
   Covid-[facility] Vaccination [facility]  Name:  Katrina Carter    MRN: [id number] DOB: 03-05-1981  10/17/2019  Katrina Carter was observed post Covid-19 immunization for 15 minutes without incident. She was provided with Vaccine Information Sheet and instruction to access the V-Safe system.   Katrina Carter was instructed to call 911 with any severe reactions post vaccine: Marland Kitchen Difficulty breathing  . Swelling of face and throat  . A fast heartbeat  . A bad rash all over body  . Dizziness and weakness   Immunizations Administered    Name Date Dose VIS Date Route   Pfizer COVID-19 Vaccine 10/17/2019 11:01 AM 0.3 mL 05/20/2018 Intramuscular   Manufacturer: [employer], [employer]   Lot: [id number]   NDC: [address]-3

## 2019-11-10 ENCOUNTER — Ambulatory Visit: Payer: Self-pay | Attending: Internal Medicine

## 2019-11-10 DIAGNOSIS — Z23 Encounter for immunization: Secondary | ICD-10-CM

## 2019-11-10 NOTE — Progress Notes (Signed)
   Covid-19 Vaccination Clinic  Name:  Katrina Carter    MRN: [id number] DOB: [birth year]  11/10/2019  Katrina Carter was observed post Covid-19 immunization for 15 minutes without incident. She was provided with Vaccine Information Sheet and instruction to access the V-Safe system.   Katrina Carter was instructed to call 911 with any severe reactions post vaccine: Marland Kitchen Difficulty breathing  . Swelling of face and throat  . A fast heartbeat  . A bad rash all over body  . Dizziness and weakness   Immunizations Administered    Name Date Dose VIS Date Route   Pfizer COVID-19 Vaccine 11/10/2019  3:53 PM 0.3 mL 05/20/2018 Intramuscular   Manufacturer: [employer], [employer]   Lot: [id number]   NDC: [address]-1

## 2020-02-10 IMAGING — DX DG ANKLE COMPLETE 3+V*R*
3 series · 3 of 3 positions shown · non-contrast
Comparison: None in PACs

CLINICAL DATA: Patient reports falling 3 months ago injuring the
right ankle. The patient reports ankle pain and mild swelling but is
able to bear weight.

EXAM:
RIGHT ANKLE - COMPLETE 3+ VIEW

[ankle ap]
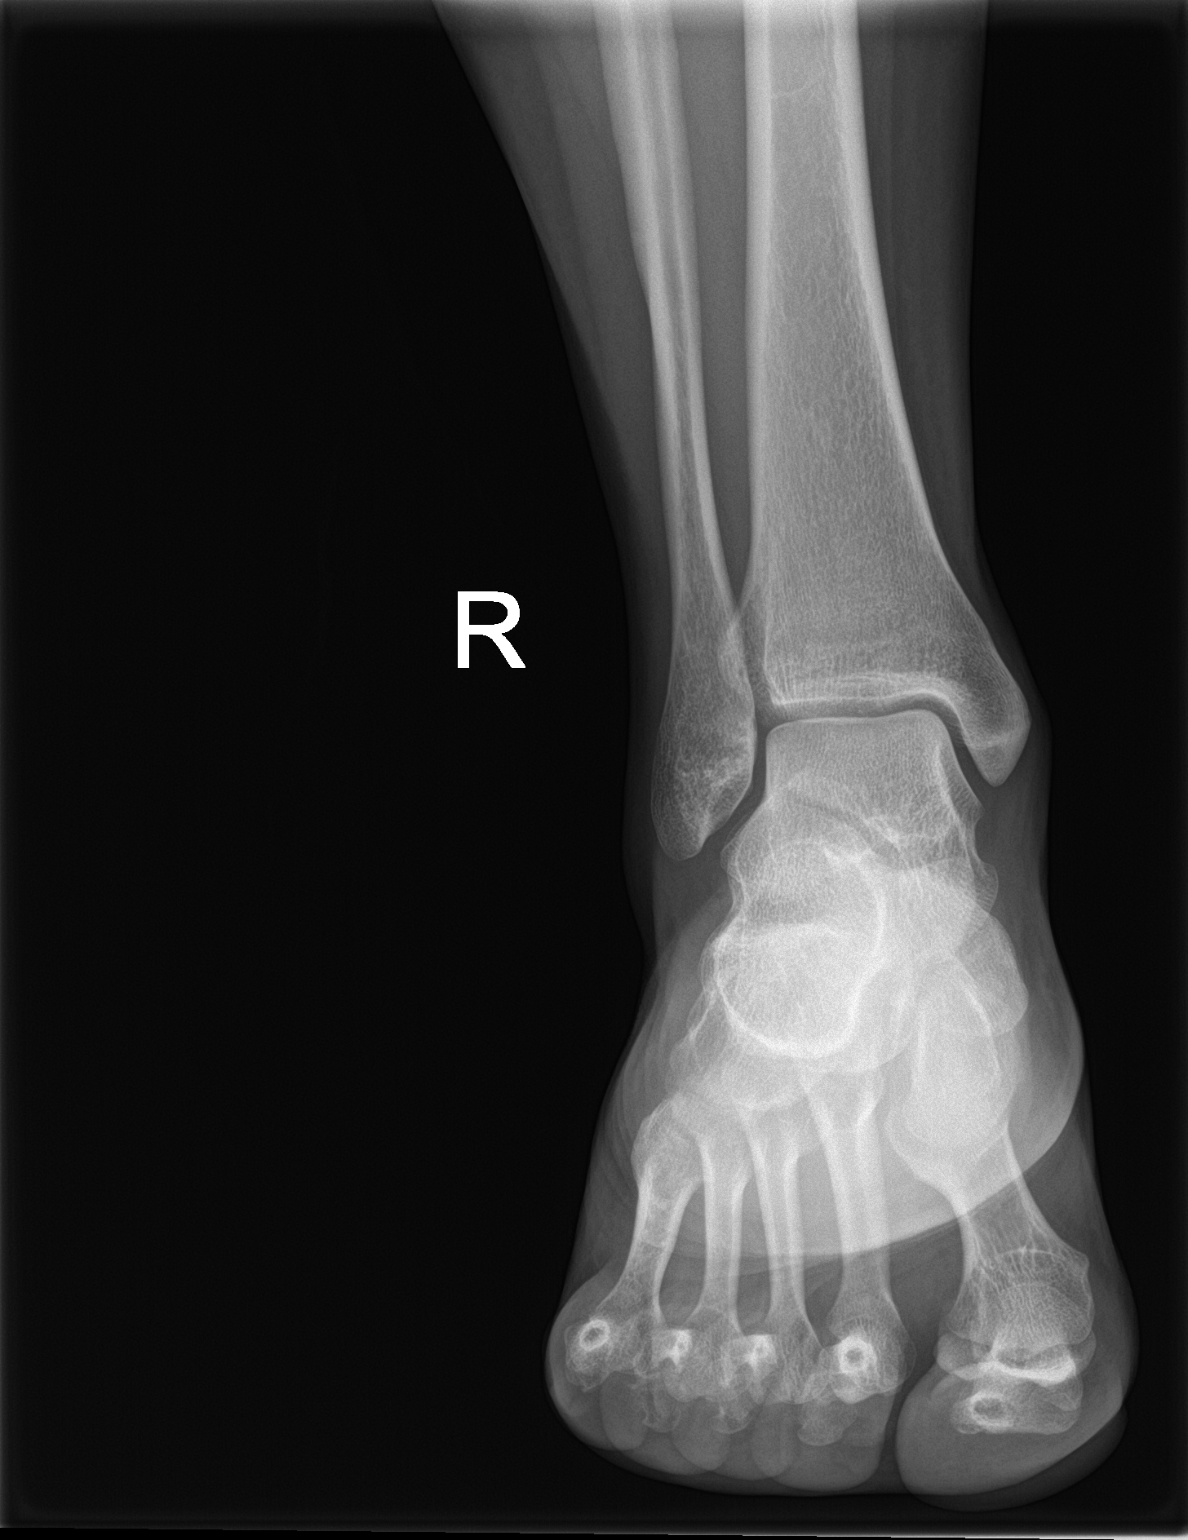

[ankle obl]
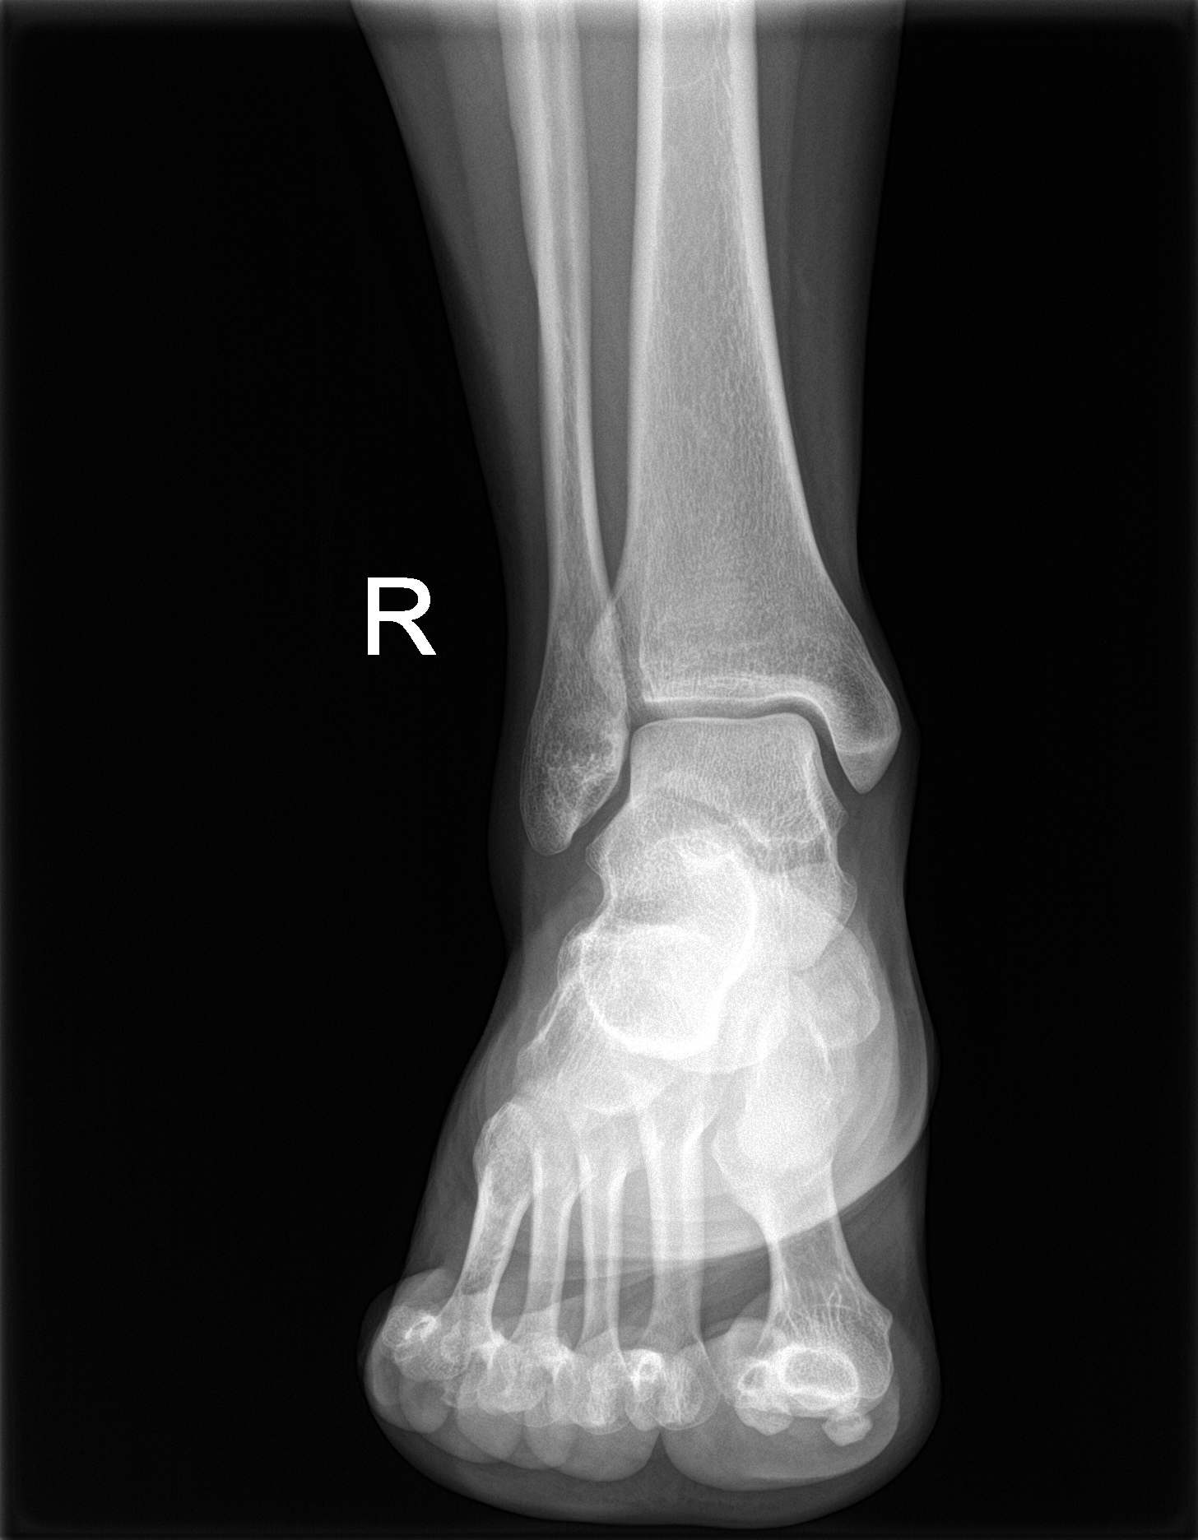

[ankle lat]
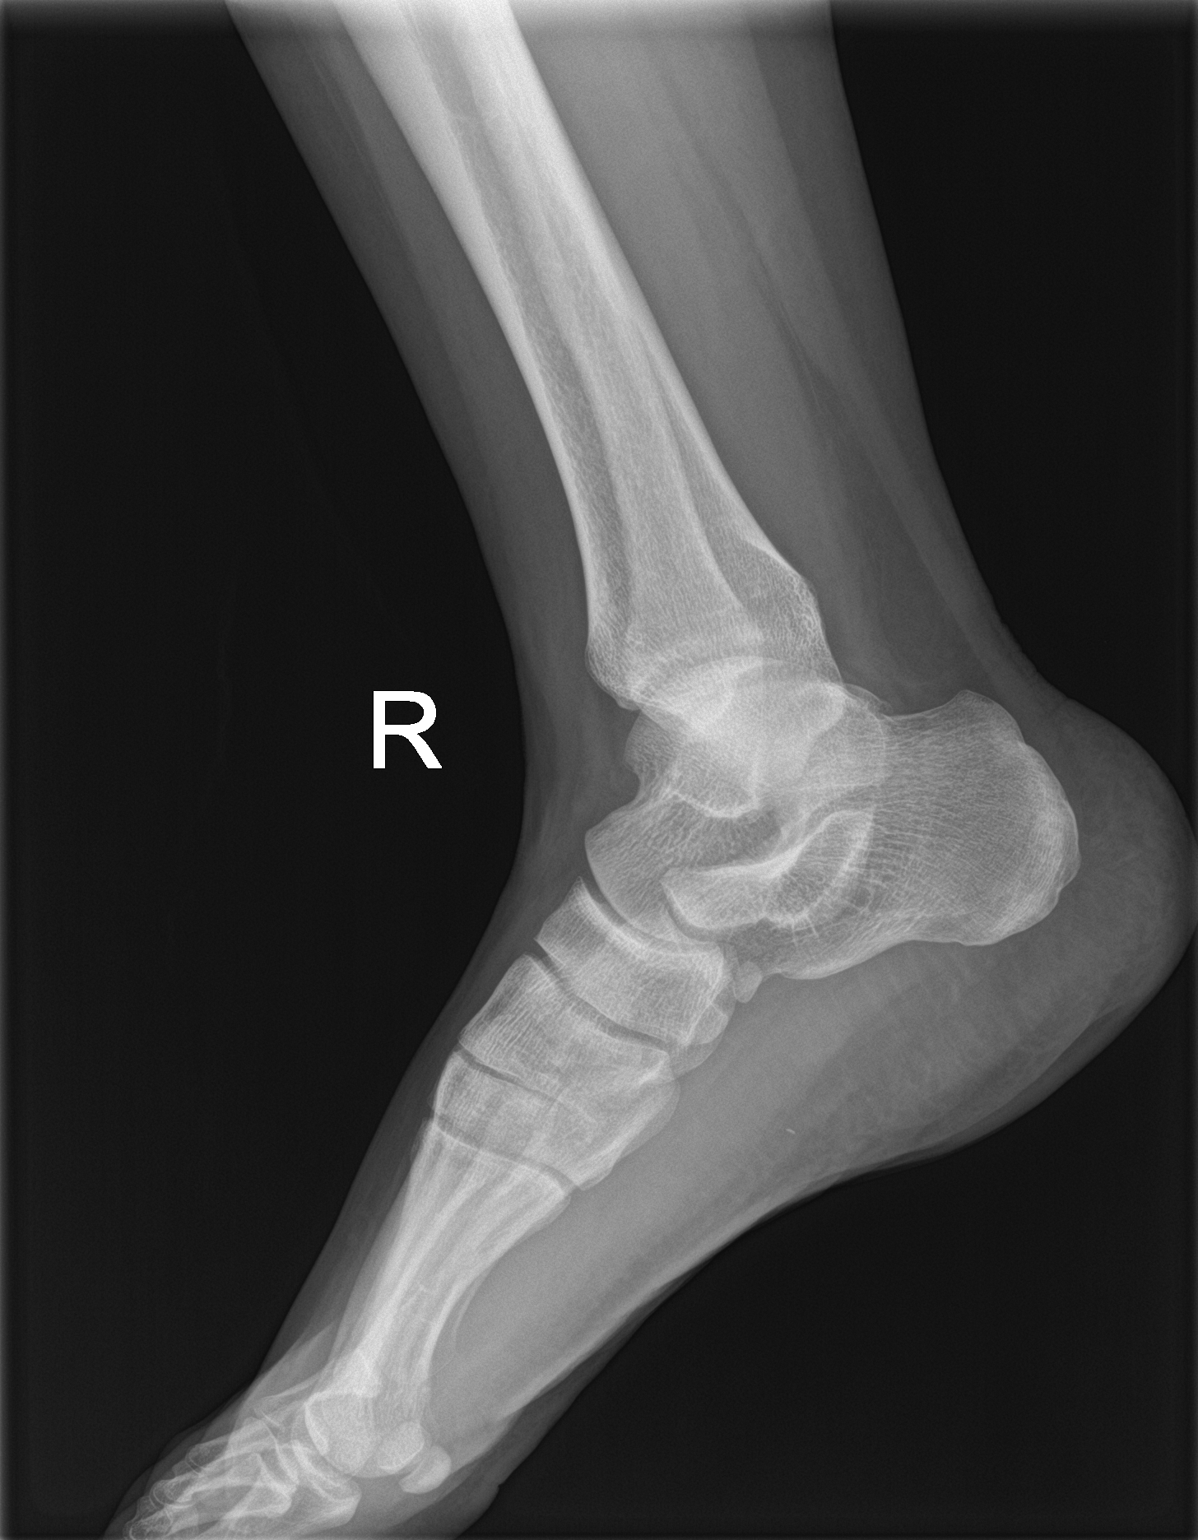

[3 of 3 positions shown; findings below may reference images not displayed]

FINDINGS: The bones are subjectively adequately mineralized. The ankle joint
mortise is preserved. The talar dome and remainder of the talus are
intact. The calcaneus is unremarkable. The distal tibia and fibula
are normal. The observed metatarsal bases are grossly normal.
IMPRESSION: There is no acute or significant chronic bony abnormality of the
right foot. There is mild soft tissue swelling laterally.
